# Patient Record
Sex: Male | Born: 1935 | Race: White | Hispanic: No | Marital: Married | Smoking: Former smoker
Health system: Southern US, Community
[De-identification: ages and names within clinical notes are randomized; demographics above are authoritative.]

## PROBLEM LIST (undated history)

## (undated) DIAGNOSIS — N186 End stage renal disease: Secondary | ICD-10-CM

## (undated) DIAGNOSIS — E785 Hyperlipidemia, unspecified: Secondary | ICD-10-CM

## (undated) DIAGNOSIS — IMO0001 Reserved for inherently not codable concepts without codable children: Secondary | ICD-10-CM

## (undated) DIAGNOSIS — E119 Type 2 diabetes mellitus without complications: Secondary | ICD-10-CM

## (undated) DIAGNOSIS — Z789 Other specified health status: Secondary | ICD-10-CM

## (undated) DIAGNOSIS — M109 Gout, unspecified: Secondary | ICD-10-CM

## (undated) DIAGNOSIS — I482 Chronic atrial fibrillation, unspecified: Secondary | ICD-10-CM

## (undated) DIAGNOSIS — I10 Essential (primary) hypertension: Secondary | ICD-10-CM

## (undated) HISTORY — PX: CHOLECYSTECTOMY: SHX55

---

## 1999-08-01 ENCOUNTER — Encounter: Payer: Self-pay | Admitting: Emergency Medicine

## 1999-08-02 ENCOUNTER — Inpatient Hospital Stay (HOSPITAL_COMMUNITY): Admission: EM | Admit: 1999-08-02 | Discharge: 1999-08-06 | Payer: Self-pay | Admitting: Emergency Medicine

## 1999-08-02 ENCOUNTER — Encounter: Payer: Self-pay | Admitting: Internal Medicine

## 1999-08-03 ENCOUNTER — Encounter: Payer: Self-pay | Admitting: General Surgery

## 2014-05-17 ENCOUNTER — Other Ambulatory Visit (HOSPITAL_COMMUNITY): Payer: Self-pay

## 2014-05-17 ENCOUNTER — Other Ambulatory Visit (HOSPITAL_COMMUNITY): Payer: Self-pay | Admitting: Family Medicine

## 2014-05-17 DIAGNOSIS — R011 Cardiac murmur, unspecified: Secondary | ICD-10-CM

## 2014-05-18 ENCOUNTER — Encounter (HOSPITAL_COMMUNITY): Payer: Self-pay | Admitting: Family Medicine

## 2014-05-21 ENCOUNTER — Encounter (HOSPITAL_COMMUNITY): Payer: Self-pay | Admitting: Cardiology

## 2015-12-20 ENCOUNTER — Inpatient Hospital Stay
Admission: EM | Admit: 2015-12-20 | Discharge: 2016-01-11 | DRG: 682 | Disposition: E | Payer: Medicare Other | Attending: Internal Medicine | Admitting: Internal Medicine

## 2015-12-20 ENCOUNTER — Emergency Department: Payer: Medicare Other

## 2015-12-20 DIAGNOSIS — Z531 Procedure and treatment not carried out because of patient's decision for reasons of belief and group pressure: Secondary | ICD-10-CM | POA: Diagnosis not present

## 2015-12-20 DIAGNOSIS — R571 Hypovolemic shock: Secondary | ICD-10-CM | POA: Diagnosis not present

## 2015-12-20 DIAGNOSIS — Z7982 Long term (current) use of aspirin: Secondary | ICD-10-CM | POA: Diagnosis not present

## 2015-12-20 DIAGNOSIS — Z803 Family history of malignant neoplasm of breast: Secondary | ICD-10-CM | POA: Diagnosis not present

## 2015-12-20 DIAGNOSIS — D631 Anemia in chronic kidney disease: Secondary | ICD-10-CM | POA: Diagnosis present

## 2015-12-20 DIAGNOSIS — N19 Unspecified kidney failure: Secondary | ICD-10-CM | POA: Diagnosis present

## 2015-12-20 DIAGNOSIS — M25422 Effusion, left elbow: Secondary | ICD-10-CM | POA: Diagnosis present

## 2015-12-20 DIAGNOSIS — K921 Melena: Secondary | ICD-10-CM | POA: Diagnosis not present

## 2015-12-20 DIAGNOSIS — E875 Hyperkalemia: Secondary | ICD-10-CM | POA: Diagnosis present

## 2015-12-20 DIAGNOSIS — I472 Ventricular tachycardia: Secondary | ICD-10-CM | POA: Diagnosis present

## 2015-12-20 DIAGNOSIS — D472 Monoclonal gammopathy: Secondary | ICD-10-CM | POA: Diagnosis present

## 2015-12-20 DIAGNOSIS — R0989 Other specified symptoms and signs involving the circulatory and respiratory systems: Secondary | ICD-10-CM

## 2015-12-20 DIAGNOSIS — N186 End stage renal disease: Secondary | ICD-10-CM | POA: Diagnosis present

## 2015-12-20 DIAGNOSIS — D691 Qualitative platelet defects: Secondary | ICD-10-CM | POA: Diagnosis present

## 2015-12-20 DIAGNOSIS — I120 Hypertensive chronic kidney disease with stage 5 chronic kidney disease or end stage renal disease: Principal | ICD-10-CM | POA: Diagnosis present

## 2015-12-20 DIAGNOSIS — E162 Hypoglycemia, unspecified: Secondary | ICD-10-CM | POA: Diagnosis present

## 2015-12-20 DIAGNOSIS — R778 Other specified abnormalities of plasma proteins: Secondary | ICD-10-CM | POA: Diagnosis present

## 2015-12-20 DIAGNOSIS — I129 Hypertensive chronic kidney disease with stage 1 through stage 4 chronic kidney disease, or unspecified chronic kidney disease: Secondary | ICD-10-CM | POA: Diagnosis not present

## 2015-12-20 DIAGNOSIS — Z515 Encounter for palliative care: Secondary | ICD-10-CM | POA: Diagnosis not present

## 2015-12-20 DIAGNOSIS — K920 Hematemesis: Secondary | ICD-10-CM | POA: Diagnosis present

## 2015-12-20 DIAGNOSIS — G9341 Metabolic encephalopathy: Secondary | ICD-10-CM | POA: Diagnosis not present

## 2015-12-20 DIAGNOSIS — D62 Acute posthemorrhagic anemia: Secondary | ICD-10-CM | POA: Diagnosis not present

## 2015-12-20 DIAGNOSIS — I482 Chronic atrial fibrillation, unspecified: Secondary | ICD-10-CM | POA: Diagnosis present

## 2015-12-20 DIAGNOSIS — Z66 Do not resuscitate: Secondary | ICD-10-CM | POA: Diagnosis not present

## 2015-12-20 DIAGNOSIS — I959 Hypotension, unspecified: Secondary | ICD-10-CM | POA: Diagnosis not present

## 2015-12-20 DIAGNOSIS — N184 Chronic kidney disease, stage 4 (severe): Secondary | ICD-10-CM | POA: Diagnosis not present

## 2015-12-20 DIAGNOSIS — I248 Other forms of acute ischemic heart disease: Secondary | ICD-10-CM | POA: Diagnosis present

## 2015-12-20 DIAGNOSIS — E1122 Type 2 diabetes mellitus with diabetic chronic kidney disease: Secondary | ICD-10-CM | POA: Diagnosis present

## 2015-12-20 DIAGNOSIS — R7989 Other specified abnormal findings of blood chemistry: Secondary | ICD-10-CM | POA: Diagnosis not present

## 2015-12-20 DIAGNOSIS — E119 Type 2 diabetes mellitus without complications: Secondary | ICD-10-CM

## 2015-12-20 DIAGNOSIS — H9190 Unspecified hearing loss, unspecified ear: Secondary | ICD-10-CM | POA: Diagnosis present

## 2015-12-20 DIAGNOSIS — E1165 Type 2 diabetes mellitus with hyperglycemia: Secondary | ICD-10-CM | POA: Diagnosis present

## 2015-12-20 DIAGNOSIS — E872 Acidosis: Secondary | ICD-10-CM | POA: Diagnosis present

## 2015-12-20 DIAGNOSIS — R55 Syncope and collapse: Secondary | ICD-10-CM | POA: Diagnosis not present

## 2015-12-20 DIAGNOSIS — M109 Gout, unspecified: Secondary | ICD-10-CM | POA: Diagnosis present

## 2015-12-20 DIAGNOSIS — Z79899 Other long term (current) drug therapy: Secondary | ICD-10-CM | POA: Diagnosis not present

## 2015-12-20 DIAGNOSIS — E785 Hyperlipidemia, unspecified: Secondary | ICD-10-CM | POA: Diagnosis present

## 2015-12-20 DIAGNOSIS — R627 Adult failure to thrive: Secondary | ICD-10-CM | POA: Diagnosis present

## 2015-12-20 DIAGNOSIS — Z87891 Personal history of nicotine dependence: Secondary | ICD-10-CM | POA: Diagnosis not present

## 2015-12-20 DIAGNOSIS — M25529 Pain in unspecified elbow: Secondary | ICD-10-CM

## 2015-12-20 DIAGNOSIS — E11649 Type 2 diabetes mellitus with hypoglycemia without coma: Secondary | ICD-10-CM | POA: Diagnosis present

## 2015-12-20 DIAGNOSIS — K922 Gastrointestinal hemorrhage, unspecified: Secondary | ICD-10-CM | POA: Diagnosis not present

## 2015-12-20 DIAGNOSIS — Z794 Long term (current) use of insulin: Secondary | ICD-10-CM | POA: Diagnosis not present

## 2015-12-20 DIAGNOSIS — N179 Acute kidney failure, unspecified: Secondary | ICD-10-CM | POA: Insufficient documentation

## 2015-12-20 DIAGNOSIS — R112 Nausea with vomiting, unspecified: Secondary | ICD-10-CM

## 2015-12-20 DIAGNOSIS — E871 Hypo-osmolality and hyponatremia: Secondary | ICD-10-CM | POA: Diagnosis present

## 2015-12-20 DIAGNOSIS — D696 Thrombocytopenia, unspecified: Secondary | ICD-10-CM | POA: Diagnosis present

## 2015-12-20 DIAGNOSIS — I10 Essential (primary) hypertension: Secondary | ICD-10-CM | POA: Diagnosis present

## 2015-12-20 HISTORY — DX: Essential (primary) hypertension: I10

## 2015-12-20 HISTORY — DX: Other specified health status: Z78.9

## 2015-12-20 HISTORY — DX: Reserved for inherently not codable concepts without codable children: IMO0001

## 2015-12-20 HISTORY — DX: End stage renal disease: N18.6

## 2015-12-20 HISTORY — DX: Gout, unspecified: M10.9

## 2015-12-20 HISTORY — DX: Type 2 diabetes mellitus without complications: E11.9

## 2015-12-20 HISTORY — DX: Hyperlipidemia, unspecified: E78.5

## 2015-12-20 HISTORY — DX: Chronic atrial fibrillation, unspecified: I48.20

## 2015-12-20 LAB — URINALYSIS COMPLETE WITH MICROSCOPIC (ARMC ONLY)
BILIRUBIN URINE: NEGATIVE
Glucose, UA: NEGATIVE mg/dL
Hgb urine dipstick: NEGATIVE
KETONES UR: NEGATIVE mg/dL
LEUKOCYTES UA: NEGATIVE
NITRITE: NEGATIVE
Protein, ur: NEGATIVE mg/dL
RBC / HPF: NONE SEEN RBC/hpf (ref 0–5)
SPECIFIC GRAVITY, URINE: 1.011 (ref 1.005–1.030)
pH: 5 (ref 5.0–8.0)

## 2015-12-20 LAB — BASIC METABOLIC PANEL
ANION GAP: 16 — AB (ref 5–15)
BUN: 143 mg/dL — ABNORMAL HIGH (ref 6–20)
CALCIUM: 8.9 mg/dL (ref 8.9–10.3)
CO2: 18 mmol/L — AB (ref 22–32)
Chloride: 102 mmol/L (ref 101–111)
Creatinine, Ser: 7.55 mg/dL — ABNORMAL HIGH (ref 0.61–1.24)
GFR, EST AFRICAN AMERICAN: 7 mL/min — AB (ref >60)
GFR, EST NON AFRICAN AMERICAN: 6 mL/min — AB (ref >60)
GLUCOSE: 81 mg/dL (ref 65–99)
POTASSIUM: 5 mmol/L (ref 3.5–5.1)
Sodium: 136 mmol/L (ref 135–145)

## 2015-12-20 LAB — GLUCOSE, CAPILLARY
GLUCOSE-CAPILLARY: 51 mg/dL — AB (ref 65–99)
GLUCOSE-CAPILLARY: 52 mg/dL — AB (ref 65–99)
Glucose-Capillary: 88 mg/dL (ref 65–99)
Glucose-Capillary: 101 mg/dL — ABNORMAL HIGH (ref 65–99)
Glucose-Capillary: 75 mg/dL (ref 65–99)

## 2015-12-20 LAB — CBC WITH DIFFERENTIAL/PLATELET
BASOS ABS: 0 10*3/uL (ref 0–0.1)
BASOS PCT: 0 %
EOS PCT: 1 %
Eosinophils Absolute: 0.1 10*3/uL (ref 0–0.7)
HEMATOCRIT: 37.7 % — AB (ref 40.0–52.0)
Hemoglobin: 12.6 g/dL — ABNORMAL LOW (ref 13.0–18.0)
LYMPHS ABS: 0.7 10*3/uL — AB (ref 1.0–3.6)
LYMPHS PCT: 7 %
MCH: 30.8 pg (ref 26.0–34.0)
MCHC: 33.5 g/dL (ref 32.0–36.0)
MCV: 92 fL (ref 80.0–100.0)
MONO ABS: 1 10*3/uL (ref 0.2–1.0)
MONOS PCT: 9 %
Neutro Abs: 8.5 10*3/uL — ABNORMAL HIGH (ref 1.4–6.5)
Neutrophils Relative %: 83 %
Platelets: 156 10*3/uL (ref 150–440)
RBC: 4.1 MIL/uL — AB (ref 4.40–5.90)
RDW: 15.2 % — AB (ref 11.5–14.5)
WBC: 10.3 10*3/uL (ref 3.8–10.6)

## 2015-12-20 LAB — TROPONIN I: Troponin I: 0.21 ng/mL — ABNORMAL HIGH (ref <0.031)

## 2015-12-20 MED ORDER — DEXTROSE 50 % IV SOLN
INTRAVENOUS | Status: AC
Start: 2015-12-20 — End: 2015-12-20
  Administered 2015-12-20: 22:00:00
  Filled 2015-12-20: qty 50

## 2015-12-20 MED ORDER — HEPARIN SODIUM (PORCINE) 5000 UNIT/ML IJ SOLN
5000.0000 [IU] | Freq: Three times a day (TID) | INTRAMUSCULAR | Status: DC
  Administered 2015-12-21 – 2015-12-26 (×16): 5000 [IU] via SUBCUTANEOUS
  Filled 2015-12-20 (×16): qty 1

## 2015-12-20 MED ORDER — DEXTROSE 50 % IV SOLN
25.0000 mL | Freq: Once | INTRAVENOUS | Status: DC
  Administered 2015-12-21: 25 g via INTRAVENOUS
  Filled 2015-12-20: qty 50

## 2015-12-20 MED ORDER — DIGOXIN 125 MCG PO TABS
0.1250 mg | ORAL_TABLET | Freq: Every day | ORAL | Status: DC
  Administered 2015-12-21: 0.125 mg via ORAL
  Filled 2015-12-20: qty 1

## 2015-12-20 MED ORDER — SIMVASTATIN 40 MG PO TABS
40.0000 mg | ORAL_TABLET | Freq: Every day | ORAL | Status: DC
  Administered 2015-12-21 – 2015-12-22 (×2): 40 mg via ORAL
  Filled 2015-12-20 (×2): qty 1

## 2015-12-20 MED ORDER — SODIUM CHLORIDE 0.9 % IV SOLN: INTRAVENOUS | Status: DC

## 2015-12-20 MED ORDER — DEXTROSE-NACL 5-0.9 % IV SOLN
INTRAVENOUS | Status: DC
  Administered 2015-12-20: 22:00:00 via INTRAVENOUS

## 2015-12-20 MED ORDER — ASPIRIN EC 81 MG PO TBEC
81.0000 mg | DELAYED_RELEASE_TABLET | Freq: Every day | ORAL | Status: DC
  Administered 2015-12-21 – 2015-12-25 (×5): 81 mg via ORAL
  Filled 2015-12-20 (×5): qty 1

## 2015-12-20 MED ORDER — ACETAMINOPHEN 650 MG RE SUPP
650.0000 mg | Freq: Four times a day (QID) | RECTAL | Status: DC | PRN
Start: 2015-12-20 — End: 2015-12-27

## 2015-12-20 MED ORDER — SODIUM CHLORIDE 0.9% FLUSH
3.0000 mL | Freq: Two times a day (BID) | INTRAVENOUS | Status: DC
  Administered 2015-12-20 – 2015-12-26 (×12): 3 mL via INTRAVENOUS

## 2015-12-20 MED ORDER — ONDANSETRON HCL 4 MG/2ML IJ SOLN
4.0000 mg | Freq: Four times a day (QID) | INTRAMUSCULAR | Status: DC | PRN
  Administered 2015-12-22 – 2015-12-26 (×3): 4 mg via INTRAVENOUS
  Filled 2015-12-20 (×3): qty 2

## 2015-12-20 MED ORDER — ACETAMINOPHEN 325 MG PO TABS
650.0000 mg | ORAL_TABLET | Freq: Four times a day (QID) | ORAL | Status: DC | PRN
Start: 2015-12-20 — End: 2015-12-27

## 2015-12-20 MED ORDER — ONDANSETRON HCL 4 MG PO TABS: 4.0000 mg | ORAL_TABLET | Freq: Four times a day (QID) | ORAL | Status: DC | PRN

## 2015-12-20 NOTE — ED Notes (Signed)
fsbs 75 - Dr Clearnce Hasten notified

## 2015-12-20 NOTE — ED Provider Notes (Addendum)
[facility] Emergency Department Provider Note  ____________________________________________  Time seen: Seen upon arrival to the emergency department  I have reviewed the triage vital signs and the nursing notes.   HISTORY  Chief Complaint Hypoglycemia    HPI Anker Kravec is a 82 y.o. male with a history of diabetes who is presenting today with hypoglycemia. Per the EMS crew he has been seen twice today by EMS. He was seen this morning with hypoglycemia to the 40s. He was found by his daughter to be unresponsive. By EMS she was given an amp of D50 and then he had returned to his baseline. He had no complaints after the initial D50 this morning and stated home. He did not eat all day and then was found to be unresponsive again. Given D50 en route with return to his baseline level of consciousness and also started on D10 infusion. The patient has no complaints and is not claiming any pain right now. Despite still taking his insulin and he has not eaten all day. His wife is currently in the hospital at this time. He denies chest pain, burning with urination cough or fever. When asked why he didn't need he says "I guess I wasn't hungry."   Past Medical History  Diagnosis Date  . Chronic a-fib ([place])   . Type 2 diabetes mellitus ([place])   . Gout   . HTN (hypertension)   . HLD (hyperlipidemia)   . ESRD (end stage renal disease) [facility])     Patient Active Problem List   Diagnosis Date Noted  . Chronic a-fib ([place]) 12/16/2015  . Hypoglycemia 01/08/2016  . Elevated troponin 12/28/2015  . Type 2 diabetes mellitus ([place]) 01/01/2016  . HTN (hypertension) 01/02/2016  . HLD (hyperlipidemia) 01/08/2016    Past Surgical History  Procedure Laterality Date  . Cholecystectomy      Current Outpatient Rx  Name  Route  Sig  Dispense  Refill  . allopurinol (ZYLOPRIM) 300 MG tablet   Oral   Take 300 mg by mouth daily.         Marland Kitchen aspirin EC 81 MG tablet   Oral   Take 81  mg by mouth daily.         . digoxin (LANOXIN) 0.125 MG tablet   Oral   Take 0.125 mg by mouth daily.         . furosemide (LASIX) 20 MG tablet   Oral   Take 20 mg by mouth daily.         Marland Kitchen glimepiride (AMARYL) 4 MG tablet   Oral   Take 6 mg by mouth daily with breakfast.         . insulin glargine (LANTUS) 100 UNIT/ML injection   Subcutaneous   Inject 50 Units into the skin daily.          . pioglitazone (ACTOS) 45 MG tablet   Oral   Take 45 mg by mouth daily.         . simvastatin (ZOCOR) 40 MG tablet   Oral   Take 40 mg by mouth daily.          Marland Kitchen torsemide (DEMADEX) 20 MG tablet   Oral   Take 20 mg by mouth daily.         . Vitamin D, Ergocalciferol, (DRISDOL) 50000 units CAPS capsule   Oral   Take 50,000 Units by mouth every 7 (seven) days. Pt takes on Monday.  Allergies Review of patient's allergies indicates no known allergies.  Family History  Problem Relation Age of Onset  . Breast cancer Mother     Social History Social History  Substance Use Topics  . Smoking status: Never Smoker   . Smokeless tobacco: Not on file  . Alcohol Use: No    Review of Systems Constitutional: No fever/chills Eyes: No visual changes. ENT: No sore throat. Cardiovascular: Denies chest pain. Respiratory: Denies shortness of breath. Gastrointestinal: No abdominal pain.  No nausea, no vomiting.  No diarrhea.  No constipation. Genitourinary: Negative for dysuria. Musculoskeletal: Negative for back pain. Skin: Negative for rash. Neurological: Negative for headaches, focal weakness or numbness.  10-point ROS otherwise negative.  ____________________________________________   PHYSICAL EXAM:  VITAL SIGNS: ED Triage Vitals  Enc Vitals Group     BP 12/19/2015 1841 136/60 mmHg     Pulse Rate 01/08/2016 1841 92     Resp --      Temp 01/07/2016 1841 98.3 F (36.8 C)     Temp Source 12/18/2015 1841 Oral     SpO2 12/23/2015 1841 99 %     Weight  12/22/2015 1841 240 lb (108.863 kg)     Height 01/05/2016 1841 6\' 1"  (1.854 m)     Head Cir --      Peak Flow --      Pain Score --      Pain Loc --      Pain Edu? --      Excl. in [place]? --     Constitutional: Alert and oriented. Well appearing and in no acute distress. Eyes: Conjunctivae are normal. PERRL. EOMI. Head: Atraumatic. Nose: No congestion/rhinnorhea. Mouth/Throat: Mucous membranes are moist.  Oropharynx non-erythematous. Neck: No stridor.   Cardiovascular: Normal rate, regular rhythm. Grossly normal heart sounds.  Good peripheral circulation. Respiratory: Normal respiratory effort.  No retractions. Lungs CTAB. Gastrointestinal: Soft and nontender. No distention. No abdominal bruits. No CVA tenderness. Musculoskeletal: No lower extremity tenderness.  Bilateral and moderate lower extremity edema which the patient says is chronic. No joint effusions. Neurologic:  Normal speech and language. No gross focal neurologic deficits are appreciated. No gait instability. Skin:  Skin is warm, dry and intact. No rash noted. Psychiatric: Mood and affect are normal. Speech and behavior are normal.  ____________________________________________   LABS (all labs ordered are listed, but only abnormal results are displayed)  Labs Reviewed  CBC WITH DIFFERENTIAL/PLATELET - Abnormal; Notable for the following:    RBC 4.10 (*)    Hemoglobin 12.6 (*)    HCT 37.7 (*)    RDW 15.2 (*)    Neutro Abs 8.5 (*)    Lymphs Abs 0.7 (*)    All other components within normal limits  BASIC METABOLIC PANEL - Abnormal; Notable for the following:    CO2 18 (*)    BUN 143 (*)    Creatinine, Ser 7.55 (*)    GFR calc non Af Amer 6 (*)    GFR calc Af Amer 7 (*)    Anion gap 16 (*)    All other components within normal limits  TROPONIN I - Abnormal; Notable for the following:    Troponin I 0.21 (*)    All other components within normal limits  URINALYSIS COMPLETEWITH MICROSCOPIC ([facility] ONLY) - Abnormal;  Notable for the following:    Color, Urine STRAW (*)    APPearance CLEAR (*)    Bacteria, UA RARE (*)    Squamous Epithelial / LPF 0-5 (*)  All other components within normal limits  GLUCOSE, CAPILLARY - Abnormal; Notable for the following:    Glucose-Capillary 101 (*)    All other components within normal limits  GLUCOSE, CAPILLARY  CBG MONITORING, ED  CBG MONITORING, ED  CBG MONITORING, ED  CBG MONITORING, ED   ____________________________________________  EKG  ED ECG REPORT I, [place],  Youlanda Roys, the attending physician, personally viewed and interpreted this ECG.   Date: 01/09/2016  EKG Time: 1920  Rate: 96  Rhythm: atrial fibrillation, rate 96  Axis: Normal  Intervals:none  ST&T Change: No ST segment elevation or depression. No abnormal T-wave inversion.  ____________________________________________  RADIOLOGY  Cardiomegaly with mild vascular congestion. Right pleural effusion. No overt failure or infiltrates. ____________________________________________   PROCEDURES   ____________________________________________   INITIAL IMPRESSION / ASSESSMENT AND PLAN / ED COURSE  Pertinent labs & imaging results that were available during my care of the patient were reviewed by me and considered in my medical decision making (see chart for details).  ----------------------------------------- 9:10 PM on 01/04/2016 -----------------------------------------  Patient able to tolerate by mouth and maintain normal glucose levels. Discussed with the patient his severely impaired kidney function and need for [facility]. This is likely the reason for his repeat episodes of hypoglycemia because he is not clearing his diabetes medications. It is also likely that he is becoming hypoglycemic because he has decreased appetite and is not taking any food by mouth. He will be admitted to the hospital. He was signed out to Dr. Anselm Jungling. The patient as well as his son understand  this plan and are willing to comply per the patient continues without any complaints at this time. ____________________________________________   FINAL CLINICAL IMPRESSION(S) / ED DIAGNOSES  Hypoglycemia, repeat episodes. Acute renal failure. Uremia.    Orbie Pyo, MD 01/02/2016 2111  Patient with intact mentation but with repeat glucose at 51. We'll give one half ampule of D50 and then start a D5 infusion. I alerted the inpatient team, Dr. Jannifer Franklin who agrees with this plan.  Orbie Pyo, MD 01/09/2016 2155

## 2015-12-20 NOTE — H&P (Addendum)
[place] [facility] [place] NAME: Miguel Guerrero    MR#:  [id number]  DATE OF BIRTH:  1933/08/15  DATE OF ADMISSION:  12/26/2015  PRIMARY CARE PHYSICIAN: No primary care provider on file.   REQUESTING/REFERRING PHYSICIAN: Clearnce Hasten, MD  CHIEF COMPLAINT:   Chief Complaint  Patient presents with  . Hypoglycemia    HISTORY OF PRESENT ILLNESS:  Miguel Guerrero  is a 82 y.o. male who presents with 2 episodes of hypoglycemia today, with confusion and decreased responsiveness. Patient is hard of hearing, and also does not remember everything about the events today. His sons are present with him in the ED and provide the history. They state that he was found this morning by one son, and that he was on the floor beside his bed and had soiled himself. He was acting confused, so his son called EMS and they found his blood sugar to be in the 40s. They gave him hypoglycemic treatment as blood sugar improved, and his symptoms resolved somewhat. Later that afternoon he was found by his other son and his daughter to be unresponsive. EMS was called again his blood sugar was again found to be extremely low. He was given hypoglycemic treatment and brought to the ED for evaluation. His son states that recently he has had very poor by mouth intake. Despite this a couple weeks of his blood sugars very elevated and his anti-glycemic medications were increased. He is also been complaining of being very cold all the time recently, and of not having any energy and sleeping most of the time. He denies any infectious symptoms. He denies any chest pain. In the ED was found to have a mildly elevated troponin, a very high creatinine (this is in the setting of known severe stage IV chronic kidney disease). Hospitalists were called for further evaluation and admission.  PAST MEDICAL HISTORY:   Past Medical History  Diagnosis Date  . Chronic a-fib ([place])   . Type 2 diabetes mellitus ([place])    . Gout   . HTN (hypertension)   . HLD (hyperlipidemia)   . ESRD (end stage renal disease) ([place])     PAST SURGICAL HISTORY:   Past Surgical History  Procedure Laterality Date  . Cholecystectomy      SOCIAL HISTORY:   Social History  Substance Use Topics  . Smoking status: Never Smoker   . Smokeless tobacco: Not on file  . Alcohol Use: No    FAMILY HISTORY:   Family History  Problem Relation Age of Onset  . Breast cancer Mother     DRUG ALLERGIES:  No Known Allergies  MEDICATIONS AT HOME:   Prior to Admission medications   Medication Sig Start Date End Date Taking? Authorizing Provider  allopurinol (ZYLOPRIM) 300 MG tablet Take 300 mg by mouth daily.   Yes Historical Provider, MD  aspirin EC 81 MG tablet Take 81 mg by mouth daily.   Yes Historical Provider, MD  digoxin (LANOXIN) 0.125 MG tablet Take 0.125 mg by mouth daily.   Yes Historical Provider, MD  furosemide (LASIX) 20 MG tablet Take 20 mg by mouth daily.   Yes Historical Provider, MD  glimepiride (AMARYL) 4 MG tablet Take 6 mg by mouth daily with breakfast.   Yes Historical Provider, MD  insulin glargine (LANTUS) 100 UNIT/ML injection Inject 50 Units into the skin daily.    Yes Historical Provider, MD  pioglitazone (ACTOS) 45 MG tablet Take 45 mg by mouth daily.  Yes Historical Provider, MD  simvastatin (ZOCOR) 40 MG tablet Take 40 mg by mouth daily.    Yes Historical Provider, MD  torsemide (DEMADEX) 20 MG tablet Take 20 mg by mouth daily.   Yes Historical Provider, MD  Vitamin D, Ergocalciferol, (DRISDOL) 50000 units CAPS capsule Take 50,000 Units by mouth every 7 (seven) days. Pt takes on Monday.   Yes Historical Provider, MD    REVIEW OF SYSTEMS:  Review of Systems  Constitutional: Negative for fever, chills, weight loss and malaise/fatigue.  HENT: Negative for ear pain, hearing loss and tinnitus.   Eyes: Negative for blurred vision, double vision, pain and redness.  Respiratory: Negative for cough,  hemoptysis and shortness of breath.   Cardiovascular: Negative for chest pain, palpitations, orthopnea and leg swelling.  Gastrointestinal: Negative for nausea, vomiting, abdominal pain, diarrhea and constipation.  Genitourinary: Negative for dysuria, frequency and hematuria.  Musculoskeletal: Negative for back pain, joint pain and neck pain.  Skin:       No acne, rash, or lesions  Neurological: Negative for dizziness, tremors, focal weakness and weakness.       Intermittent confusion  Endo/Heme/Allergies: Negative for polydipsia. Does not bruise/bleed easily.       Hypoglycemia  Psychiatric/Behavioral: Negative for depression. The patient is not nervous/anxious and does not have insomnia.      VITAL SIGNS:   Filed Vitals:   12/21/2015 1841 12/21/2015 1845 01/09/2016 1915 01/01/2016 1941  BP: 136/60   142/52  Pulse: 92  106 90  Temp: 98.3 F (36.8 C)     TempSrc: Oral     Resp:  24 17 16   Height: 6\' 1"  (1.854 m)     Weight: 108.863 kg (240 lb)     SpO2: 99%  100% 98%   Wt Readings from Last 3 Encounters:  01/07/2016 108.863 kg (240 lb)    PHYSICAL EXAMINATION:  Physical Exam  Vitals reviewed. Constitutional: He is oriented to person, place, and time. He appears well-developed and well-nourished. No distress.  HENT:  Head: Normocephalic and atraumatic.  Mouth/Throat: Oropharynx is clear and moist.  Hard of hearing  Eyes: Conjunctivae and EOM are normal. Pupils are equal, round, and reactive to light. No scleral icterus.  Neck: Normal range of motion. Neck supple. No JVD present. No thyromegaly present.  Cardiovascular: Normal rate and intact distal pulses.  Exam reveals no gallop and no friction rub.   No murmur heard. irregular  Respiratory: Effort normal and breath sounds normal. No respiratory distress. He has no wheezes. He has no rales.  GI: Soft. Bowel sounds are normal. He exhibits no distension. There is no tenderness.  Musculoskeletal: Normal range of motion. He exhibits  no edema.  No arthritis, no gout  Lymphadenopathy:    He has no cervical adenopathy.  Neurological: He is alert and oriented to person, place, and time. No cranial nerve deficit.  No dysarthria, no aphasia  Skin: Skin is warm and dry. No rash noted. No erythema.  Psychiatric: He has a normal mood and affect. His behavior is normal. Judgment and thought content normal.    LABORATORY PANEL:   CBC  Recent Labs Lab 01/06/2016 1847  WBC 10.3  HGB 12.6*  HCT 37.7*  PLT 156   ------------------------------------------------------------------------------------------------------------------  Chemistries   Recent Labs Lab 12/31/2015 1847  NA 136  K 5.0  CL 102  CO2 18*  GLUCOSE 81  BUN 143*  CREATININE 7.55*  CALCIUM 8.9   ------------------------------------------------------------------------------------------------------------------  Cardiac Enzymes  Recent Labs  Lab 12/14/2015 1847  TROPONINI 0.21*   ------------------------------------------------------------------------------------------------------------------  RADIOLOGY:  Dg Chest 2 View  01/06/2016  CLINICAL DATA:  Repeated episodes of hypoglycemia. EXAM: CHEST  2 VIEW COMPARISON:  None. FINDINGS: Cardiomegaly. Mild vascular congestion. RIGHT pleural effusion. No overt failure or focal infiltrates. No areas of significant atelectasis. Indeterminate age lower thoracic compression deformity. Osteopenia. IMPRESSION: Cardiomegaly with mild vascular congestion. RIGHT pleural effusion. No overt failure or infiltrates. Electronically Signed   By: Staci Righter M.D.   On: 12/15/2015 19:31    EKG:   Orders placed or performed during the hospital encounter of 01/01/2016  . ED EKG  . ED EKG  . EKG 12-Lead  . EKG 12-Lead  . EKG 12-Lead  . EKG 12-Lead    IMPRESSION AND PLAN:  Principal Problem:   Hypoglycemia - responds well to hypoglycemic protocol. We will admit him, hold all anti-glycemic medications, check his  fingerstick every 2 hours. Encourage by mouth intake Active Problems:   ESRD - this is a decrease in function from his prior reported GFR of around 19.  Will have nephrology see him and make recommendations   Elevated troponin - likely related to his chronic kidney disease and some part, also potentially related to demand from his A. fib with question of some possible underlying heart failure. However, it is impossible this time to completely rule out a primary ACS event. We will trend his troponins tonight, check an echocardiogram, get a cardiology consult.   Type 2 diabetes mellitus (HCC) - every 2 hour glucose checks as above, hypoglycemic protocol, carb modified diet, no anti-glycemic medications for now, can add these as needed when his blood sugar rises.   Chronic a-fib (HCC) - continue rate controlling medications. Patient was on anticoagulation years ago, it is unclear why he was taken off. Cardiology consult as above.   HTN (hypertension) - stable, continue home meds   HLD (hyperlipidemia) - continue home meds  All the records are reviewed and case discussed with ED provider. Management plans discussed with the patient and/or family.  DVT PROPHYLAXIS: SubQ heparin  GI PROPHYLAXIS: None  ADMISSION STATUS: Inpatient  CODE STATUS: Full Code Status History    This patient does not have a recorded code status. Please follow your organizational policy for patients in this situation.      TOTAL TIME TAKING CARE OF THIS PATIENT: 50 minutes.    Naleigha Raimondi [place] 12/19/2015, 9:10 PM  [facility]  Office  [phone]  CC: Primary care physician; No primary care provider on file.

## 2015-12-20 NOTE — ED Notes (Signed)
Alert and oriented X4 - Ate half a ham sandwich and drank half can of ginger ale

## 2015-12-20 NOTE — ED Notes (Signed)
Troponin of 0.21 reported to Dr Clearnce Hasten

## 2015-12-20 NOTE — ED Notes (Signed)
fsbs 52 with repeat to verify reading fsbs 12 - dr Clearnce Hasten  Notified and verbal orders received to give 12.5 gm of 50% dextrose now

## 2015-12-20 NOTE — ED Notes (Signed)
Pt arrived by EMS - EMS had arrived at the pt house this am and found unresponsive with a fsbs of 40 - they gave amp of D50 and cleared the scene once fsbs stable - EMS called a second time to the house this afternoon - pt found unresponsive and sweaty with a fsbs 56 - amp of D50 given bringing fsbs 125 - D10 IV at this time - Pt has 18 gauge in rt AC - last fsbs 118 and trending down - no other complaints - Pt is incontinent at this time - Hx of afib - Dtr states he has not eaten well in a few days

## 2015-12-21 ENCOUNTER — Inpatient Hospital Stay (HOSPITAL_COMMUNITY)
Admit: 2015-12-21 | Discharge: 2015-12-21 | Disposition: A | Discharge status: Home / Self Care | Payer: Medicare Other | Attending: Internal Medicine | Admitting: Internal Medicine

## 2015-12-21 ENCOUNTER — Encounter: Payer: Self-pay | Admitting: Student

## 2015-12-21 DIAGNOSIS — R7989 Other specified abnormal findings of blood chemistry: Secondary | ICD-10-CM

## 2015-12-21 DIAGNOSIS — I482 Chronic atrial fibrillation: Secondary | ICD-10-CM

## 2015-12-21 DIAGNOSIS — E162 Hypoglycemia, unspecified: Secondary | ICD-10-CM

## 2015-12-21 DIAGNOSIS — N179 Acute kidney failure, unspecified: Secondary | ICD-10-CM

## 2015-12-21 DIAGNOSIS — N186 End stage renal disease: Secondary | ICD-10-CM

## 2015-12-21 DIAGNOSIS — R55 Syncope and collapse: Secondary | ICD-10-CM

## 2015-12-21 LAB — GLUCOSE, CAPILLARY
GLUCOSE-CAPILLARY: 101 mg/dL — AB (ref 65–99)
GLUCOSE-CAPILLARY: 102 mg/dL — AB (ref 65–99)
GLUCOSE-CAPILLARY: 176 mg/dL — AB (ref 65–99)
GLUCOSE-CAPILLARY: 59 mg/dL — AB (ref 65–99)
GLUCOSE-CAPILLARY: 60 mg/dL — AB (ref 65–99)
GLUCOSE-CAPILLARY: 67 mg/dL (ref 65–99)
GLUCOSE-CAPILLARY: 71 mg/dL (ref 65–99)
GLUCOSE-CAPILLARY: 72 mg/dL (ref 65–99)
GLUCOSE-CAPILLARY: 79 mg/dL (ref 65–99)
GLUCOSE-CAPILLARY: 86 mg/dL (ref 65–99)
Glucose-Capillary: 37 mg/dL — CL (ref 65–99)
Glucose-Capillary: 58 mg/dL — ABNORMAL LOW (ref 65–99)
Glucose-Capillary: 69 mg/dL (ref 65–99)
Glucose-Capillary: 79 mg/dL (ref 65–99)
Glucose-Capillary: 82 mg/dL (ref 65–99)
Glucose-Capillary: 83 mg/dL (ref 65–99)
Glucose-Capillary: 87 mg/dL (ref 65–99)
Glucose-Capillary: 87 mg/dL (ref 65–99)
Glucose-Capillary: 95 mg/dL (ref 65–99)
Glucose-Capillary: 95 mg/dL (ref 65–99)

## 2015-12-21 LAB — TROPONIN I
TROPONIN I: 0.25 ng/mL — AB (ref <0.031)
TROPONIN I: 0.23 ng/mL — AB (ref <0.031)
Troponin I: 0.16 ng/mL — ABNORMAL HIGH (ref <0.031)

## 2015-12-21 LAB — CBC
HCT: 34.7 % — ABNORMAL LOW (ref 40.0–52.0)
Hemoglobin: 11.5 g/dL — ABNORMAL LOW (ref 13.0–18.0)
MCH: 30.5 pg (ref 26.0–34.0)
MCHC: 33.1 g/dL (ref 32.0–36.0)
MCV: 92.1 fL (ref 80.0–100.0)
PLATELETS: 146 10*3/uL — AB (ref 150–440)
RBC: 3.77 MIL/uL — AB (ref 4.40–5.90)
RDW: 15.5 % — AB (ref 11.5–14.5)
WBC: 9.4 10*3/uL (ref 3.8–10.6)

## 2015-12-21 LAB — CORTISOL: CORTISOL PLASMA: 22.2 ug/dL

## 2015-12-21 LAB — BASIC METABOLIC PANEL
Anion gap: 12 (ref 5–15)
BUN: 132 mg/dL — AB (ref 6–20)
CO2: 18 mmol/L — ABNORMAL LOW (ref 22–32)
CREATININE: 7.2 mg/dL — AB (ref 0.61–1.24)
Calcium: 8.3 mg/dL — ABNORMAL LOW (ref 8.9–10.3)
Chloride: 106 mmol/L (ref 101–111)
GFR, EST AFRICAN AMERICAN: 7 mL/min — AB (ref >60)
GFR, EST NON AFRICAN AMERICAN: 6 mL/min — AB (ref >60)
Glucose, Bld: 69 mg/dL (ref 65–99)
Potassium: 5.3 mmol/L — ABNORMAL HIGH (ref 3.5–5.1)
SODIUM: 136 mmol/L (ref 135–145)

## 2015-12-21 LAB — IRON AND TIBC
Iron: 54 ug/dL (ref 45–182)
Saturation Ratios: 23 % (ref 17.9–39.5)
TIBC: 236 ug/dL — AB (ref 250–450)
UIBC: 182 ug/dL

## 2015-12-21 LAB — MAGNESIUM: MAGNESIUM: 2 mg/dL (ref 1.7–2.4)

## 2015-12-21 LAB — TSH: TSH: 5.529 u[IU]/mL — ABNORMAL HIGH (ref 0.350–4.500)

## 2015-12-21 LAB — POTASSIUM
POTASSIUM: 5.5 mmol/L — AB (ref 3.5–5.1)
Potassium: 5.6 mmol/L — ABNORMAL HIGH (ref 3.5–5.1)

## 2015-12-21 LAB — DIGOXIN LEVEL: DIGOXIN LVL: 0.8 ng/mL (ref 0.8–2.0)

## 2015-12-21 LAB — FERRITIN: FERRITIN: 617 ng/mL — AB (ref 24–336)

## 2015-12-21 LAB — MRSA PCR SCREENING: MRSA by PCR: NEGATIVE

## 2015-12-21 LAB — HEMOGLOBIN A1C: HEMOGLOBIN A1C: 9.4 % — AB (ref 4.0–6.0)

## 2015-12-21 MED ORDER — DILTIAZEM HCL 25 MG/5ML IV SOLN
10.0000 mg | Freq: Once | INTRAVENOUS | Status: AC
  Administered 2015-12-21: 10 mg via INTRAVENOUS
  Filled 2015-12-21: qty 5

## 2015-12-21 MED ORDER — DEXTROSE 50 % IV SOLN
1.0000 | INTRAVENOUS | Status: DC | PRN
  Administered 2015-12-21 (×2): 25 mL via INTRAVENOUS
  Filled 2015-12-21: qty 50

## 2015-12-21 MED ORDER — DILTIAZEM HCL 60 MG PO TABS
60.0000 mg | ORAL_TABLET | Freq: Four times a day (QID) | ORAL | Status: DC
  Administered 2015-12-21 – 2015-12-22 (×3): 60 mg via ORAL
  Filled 2015-12-21 (×3): qty 1

## 2015-12-21 MED ORDER — SODIUM POLYSTYRENE SULFONATE 15 GM/60ML PO SUSP
30.0000 g | ORAL | Status: AC
  Administered 2015-12-21: 30 g via ORAL
  Filled 2015-12-21: qty 120

## 2015-12-21 MED ORDER — DEXTROSE 50 % IV SOLN
INTRAVENOUS | Status: AC
  Administered 2015-12-21: 25 g via INTRAVENOUS
  Filled 2015-12-21: qty 50

## 2015-12-21 MED ORDER — DEXTROSE 10 % IV SOLN
INTRAVENOUS | Status: DC
  Administered 2015-12-21 – 2015-12-22 (×2): via INTRAVENOUS

## 2015-12-21 MED ORDER — CARVEDILOL 3.125 MG PO TABS: 3.1250 mg | ORAL_TABLET | Freq: Two times a day (BID) | ORAL | Status: DC

## 2015-12-21 MED ORDER — SODIUM BICARBONATE 8.4 % IV SOLN
INTRAVENOUS | Status: DC
  Administered 2015-12-21 – 2015-12-24 (×3): via INTRAVENOUS
  Filled 2015-12-21 (×7): qty 150

## 2015-12-21 MED ORDER — DILTIAZEM HCL 25 MG/5ML IV SOLN
10.0000 mg | Freq: Once | INTRAVENOUS | Status: AC
  Administered 2015-12-21: 10 mg via INTRAVENOUS

## 2015-12-21 NOTE — Progress Notes (Signed)
Pt transfer to CCU 20 Report called Christina RN.

## 2015-12-21 NOTE — Progress Notes (Signed)
CBG for 2200 was 68.  Patient alert and oriented, sitting in bed, no complaints.  Given regular soda to drink, recheck CBG is 86.  Will continue to monitor.

## 2015-12-21 NOTE — Consult Note (Signed)
Cardiology Consult    Patient ID: Miguel Guerrero MRN: [id number], DOB/AGE: [birth year]   Admit date: 12/19/2015 Date of Consult: 12/21/2015  Primary Physician: No primary care provider on file. Reason for Consult: Elevated Troponin Primary Cardiologist: New to [place] Requesting Provider: Dr. Jannifer Franklin   History of Present Illness    Miguel Guerrero is a 82 y.o. male with past medical history of chronic atrial fibrillation, Type 2 DM, HTN, HLD, and ESRD (not on dialysis) who presented to [facility] on 01/01/2016 for repeat hypoglycemic episodes associated with decreased responsiveness.   On 01/08/2016, the patient was found by his son on the floor beside his bed. He was noted to be confused and had an episode of bowel incontinence. EMS was called and his blood sugar was 40 at the time of their arrival. D50 was administered and they departed once he was stable. That afternoon, he has decreased responsiveness again and EMS was called and brought him to the ED for evaluation.  At the time of admission, he was started on the hypoglycemia protocol. Throughout the day, he has refused to eat or drink anything and his glucose has been in the 60's. He was initially on D5 infusions but these have been increased to D10 and he was transferred to the ICU for closer monitoring. He is also being given D50 ampules as needed.   The patient has known Stage 4 CKD and his creatinine at the time of admission was 7.55 with a GFR of 6. WBC at 10.3. Hgb 12.6. Platelets 156. K+ elevated to 5.0 on admission, at 5.6 today. Dig level was checked and 0.8. Cyclic troponin values have been obtained and are 0.21, 0.25, 0.23, and 0.16. His EKG shows atrial fibrillation with HR 96, and nonspecific ST changes in the anterior leads.  In talking with the patient, one of his son's, and his son-in-law at the bedside they do not believe he is currently followed by a Cardiologist. The patient is able to provide history but seems confused with  some questions. He reports having atrial fibrillation for "many years". He was on Coumadin in the past but this was discontinued for an unknown reason. He denies any history of GI bleeds or frequent falls. The patient's son reports he is very independent in his care, living with his wife and taking care of their home.  The patient denies any history of CAD. Denies any recent chest pain, radiating pain, palpitations, orthopnea, PND, or dyspnea on exertion. Notes a decreased appetite over the past month but does not believe he was lost a significant amount of weight. Does report having lower extremity edema and is on a "fluid pill" for this. His son provides a list of his medications with both Torsemide 20mg  daily and Lasix 20mg  daily being on the list. He says the Lasix was recently started by his PCP to take on a PRN basis for increased lower extremity edema. He also points out the cardiac medications the patient is on which include Digoxin 0.125mg  daily, Cardizem CD 240mg  daily, and Zocor 40mg  daily in addition to the diuretics mentioned above.   Past Medical History   Past Medical History  Diagnosis Date  . Chronic a-fib ([place])   . Type 2 diabetes mellitus ([place])   . Gout   . HTN (hypertension)   . HLD (hyperlipidemia)   . ESRD (end stage renal disease) [facility])     Past Surgical History  Procedure Laterality Date  . Cholecystectomy  Allergies  No Known Allergies  Inpatient Medications    . aspirin EC  81 mg Oral Daily  . carvedilol  3.125 mg Oral BID WC  . heparin  5,000 Units Subcutaneous 3 times per day  . simvastatin  40 mg Oral Daily  . sodium chloride flush  3 mL Intravenous Q12H    Family History    Family History  Problem Relation Age of Onset  . Breast cancer Mother     Social History    Social History   Social History  . Marital Status: Married    Spouse Name: N/A  . Number of Children: N/A  . Years of Education: N/A   Occupational History  . Not on  file.   Social History Main Topics  . Smoking status: Former Smoker    Quit date: 11/12/1968  . Smokeless tobacco: Never Used  . Alcohol Use: No  . Drug Use: No  . Sexual Activity: Not Currently   Other Topics Concern  . Not on file   Social History Narrative  . No narrative on file     Review of Systems    General:  No chills, fever, night sweats or weight changes. Positive for decreased appetite. Cardiovascular:  No chest pain, dyspnea on exertion,  orthopnea, palpitations, paroxysmal nocturnal dyspnea. Positive for edema. Dermatological: No rash, lesions/masses Respiratory: No cough, dyspnea Urologic: No hematuria, dysuria Abdominal:   No nausea, vomiting, diarrhea, bright red blood per rectum, melena, or hematemesis Neurologic:  No visual changes. Positive for weakness and changes in mental status. All other systems reviewed and are otherwise negative except as noted above.  Physical Exam    Blood pressure 148/64, pulse 100, temperature 98 F (36.7 C), temperature source Oral, resp. rate 17, height 6\' 1"  (1.854 m), weight 237 lb 1.6 oz (107.548 kg), SpO2 95 %.  General: Pleasant, Caucasian male appearing in NAD. Psych: Normal affect. Neuro: Alert and oriented X 3. Moves all extremities spontaneously. HEENT: Normal  Neck: Supple without bruits or JVD. Lungs:  Resp regular and unlabored, CTA without wheezing or rales. Heart: Irregularly irregular, no s3, s4, or murmurs. Abdomen: Soft, non-tender, non-distended, BS + x 4.  Extremities: No clubbing or cyanosis. Trace lower extremity edema. DP/PT/Radials 2+ and equal bilaterally.  Labs    Troponin (Point of Care Test) No results for input(s): TROPIPOC in the last 72 hours.  Recent Labs  01/06/2016 1847 12/21/15 0228 12/21/15 0835  TROPONINI 0.21* 0.25* 0.23*   Lab Results  Component Value Date   WBC 9.4 12/21/2015   HGB 11.5* 12/21/2015   HCT 34.7* 12/21/2015   MCV 92.1 12/21/2015   PLT 146* 12/21/2015      Recent Labs Lab 12/21/15 0228 12/21/15 1016  NA 136  --   K 5.3* 5.6*  CL 106  --   CO2 18*  --   BUN 132*  --   CREATININE 7.20*  --   CALCIUM 8.3*  --   GLUCOSE 69  --     Radiology Studies    Dg Chest 2 View: 12/29/2015  CLINICAL DATA:  Repeated episodes of hypoglycemia. EXAM: CHEST  2 VIEW COMPARISON:  None. FINDINGS: Cardiomegaly. Mild vascular congestion. RIGHT pleural effusion. No overt failure or focal infiltrates. No areas of significant atelectasis. Indeterminate age lower thoracic compression deformity. Osteopenia. IMPRESSION: Cardiomegaly with mild vascular congestion. RIGHT pleural effusion. No overt failure or infiltrates. Electronically Signed   By: Staci Righter M.D.   On: 01/02/2016 19:31  EKG & Cardiac Imaging    EKG: Atrial fibrillation, HR 96, nonspecific ST changes in the anterior leads.  Echocardiogram: 12/21/2015 Study Conclusions  - Left ventricle: The cavity size was normal. There was mild concentric hypertrophy. Systolic function was vigorous. The estimated ejection fraction was in the range of 65% to 70%. Wall motion was normal; there were no regional wall motion abnormalities. - Aortic valve: moderately calcified leaflets. Transvalvular velocity was increased. There was very mild stenosis. Peak velocity (S): 234 cm/s. Peak gradient (S): 22 mm Hg. - Left atrium: The atrium was mildly dilated. - Right ventricle: Systolic function was normal. - Pulmonary arteries: Systolic pressure was mild to moderately elevated. PA peak pressure: 49 mm Hg (S).  Impressions: - Rhythm suggestive of atrial fibrillation.  Assessment & Plan     1. Elevated Troponin  - Cyclic troponin values have been 0.21, 0.25, 0.23, and 0.16. - EKG shows atrial fibrillation with a HR 96 and nonspecific ST changes in the anterior leads. - the patient denies any history of CAD. Does not report any anginal symptoms.  - Echo shows EF of 65-70% with no wall motion  abnormalities. Mild to moderate pulmonary HTN was noted. - likely to be due to demand ischemia in the setting of his ESRD, atrial fibrillation, and continued hypoglycemia.  2. Chronic Atrial Fibrillation - patient reports having this for "many years" and it is now managed by his PCP. - This patients CHA2DS2-VASc Score and unadjusted Ischemic Stroke Rate (% per year) is equal to 4.8 % stroke rate/year from a score of 4 (HTN, DM, Age (2)). He was on Coumadin in the past but this was discontinued for an unknown reason. Prior to his recent hypoglycemic episode, he denies any history of frequent falls. The family is not aware of a history of GI bleeding. He would not be a good candidate for NOAC due to his ESRD, even with potential decreased dosage of Eliquis. Could consider restarting Coumadin following improvement in his illness or this could be addressed by his PCP as an outpatient since he might have more knowledge about the patient's medical history and why this was discontinued.  - was on Digoxin 0.125mg  daily and Cardizem CD 240mg  daily for rate control prior to admission. With his ESRD, Digoxin has been discontinued. His Cardizem was not listed on the Home Medications yet his son provided a picture of the medication saying he takes it daily at home. We will restart his Cardizem at 60mg  Q6H to monitor his BP response before restarting Cardizem CD.   3. ESRD - reports a history of CKD. Unfortunately no previous lab results are available for review. - Creatinine elevated to 7.55 on admission. - Nephrology following.  4. Hypoglycemia - per admitting team  5. Hyperkalemia - potassium 5.0 on admission, elevated to 5.6 on 12/21/2015. - started on Bicarb drip and given Kayexalate earlier today   Signed, Erma Heritage, PA-C 12/21/2015, 2:27 PM Pager: [phone]

## 2015-12-21 NOTE — Progress Notes (Signed)
*  PRELIMINARY RESULTS* Echocardiogram 2D Echocardiogram has been performed.  Miguel Guerrero 12/21/2015, 1:49 PM

## 2015-12-21 NOTE — Care Management (Signed)
Patient with esrd transferred to icu stepdown  from 2A due to need for hypoglycemia for closer monitoring. Patient is not currently receiving any outpatient dialysis but has been discussing with with [facility].  Nephrology is following

## 2015-12-21 NOTE — Progress Notes (Signed)
Blood sugar 65 patient encouraged to drink 4 oz of orange juice. Patient refusing to eat or drink states that he does not feel hungry. Blood sugar rechecked and found to be 60 asymptomatic. 12.5 mg of dextrose given, blood sugar was rechecked and found to be 101 post administration of dextrose. Patient encouraged to continue to drink orange juice. Dr. Ether Griffins notified of the above occurrence. Will continue to monitor patient.

## 2015-12-21 NOTE — Progress Notes (Signed)
[place] [facility] [place] NAME: Miguel Guerrero    MR#:  [id number]  DATE OF BIRTH:  09/28/1933  SUBJECTIVE:  CHIEF COMPLAINT:   Chief Complaint  Patient presents with  . Hypoglycemia   Patient is 82 year old Caucasian male with past medical history significant for history of chronic atrial fibrillation, diabetes mellitus type 2, gout, hypertension, hyperlipidemia, CK D stage IV  who presents to the hospital with hypoglycemia episodes, confusion, weakness, feeling cold. On arrival to emergency room, he was noted to be tachypneic as well as tachycardic, however, not hypotensive. His labs revealed elevated troponin to 0.25, hyperkalemia, anemia and thrombocytopenia. Patient was admitted to the hospital for further evaluation. He was initiated on D5 10 water solution and required at least 4 Amps of D50 injection to maintain his blood glucose levels. Patient is confused and not able to provide review of systems. I met with patient's son-in-law and discussed his case for approximately 15 minutes.  Patient admits of poor appetite, just not wanting to eat. Denies nausea, vomiting, diarrhea or abdominal pain Review of Systems  Unable to perform ROS: medical condition    VITAL SIGNS: Blood pressure 129/54, pulse 97, temperature 98 F (36.7 C), temperature source Oral, resp. rate 22, height _0  (1.854 m), weight 107.548 kg (237 lb 1.6 oz), SpO2 97 %.  PHYSICAL EXAMINATION:   GENERAL:  82 y.o.-year-old patient lying in the bed with no acute distress.  EYES: Pupils equal, round, reactive to light and accommodation. No scleral icterus. Extraocular muscles intact.  HEENT: Head atraumatic, normocephalic. Oropharynx and nasopharynx clear.  NECK:  Supple, no jugular venous distention. No thyroid enlargement, no tenderness.  LUNGS: Normal breath sounds bilaterally, no wheezing, rales,rhonchi or crepitation. No use of accessory muscles of respiration.   CARDIOVASCULAR: S1, S2 normal. No murmurs, rubs, or gallops.  ABDOMEN: Soft, nontender, nondistended. Bowel sounds present. No organomegaly or mass.  EXTREMITIES: No pedal edema, cyanosis, or clubbing.  NEUROLOGIC: Cranial nerves II through XII are intact. Muscle strength 5/5 in all extremities. Sensation intact. Gait not checked.  PSYCHIATRIC: The patient is alert and oriented x 3.  SKIN: No obvious rash, lesion, or ulcer.   ORDERS/RESULTS REVIEWED:   CBC  Recent Labs Lab 12/22/2015 1847 12/21/15 0228  WBC 10.3 9.4  HGB 12.6* 11.5*  HCT 37.7* 34.7*  PLT 156 146*  MCV 92.0 92.1  MCH 30.8 30.5  MCHC 33.5 33.1  RDW 15.2* 15.5*  LYMPHSABS 0.7*  --   MONOABS 1.0  --   EOSABS 0.1  --   BASOSABS 0.0  --    ------------------------------------------------------------------------------------------------------------------  Chemistries   Recent Labs Lab 12/22/2015 1847 12/21/15 0228 12/21/15 1015 12/21/15 1016  NA 136 136  --   --   K 5.0 5.3*  --  5.6*  CL 102 106  --   --   CO2 18* 18*  --   --   GLUCOSE 81 69  --   --   BUN 143* 132*  --   --   CREATININE 7.55* 7.20*  --   --   CALCIUM 8.9 8.3*  --   --   MG  --   --  2.0  --    ------------------------------------------------------------------------------------------------------------------ estimated creatinine clearance is 10.3 mL/min (by C-G formula based on Cr of 7.2). ------------------------------------------------------------------------------------------------------------------  Recent Labs  12/21/15 0228  TSH 5.529*    Cardiac Enzymes  Recent Labs Lab 12/25/2015 1847 12/21/15 0228 12/21/15 2549  TROPONINI  0.21* 0.25* 0.23*   ------------------------------------------------------------------------------------------------------------------ Invalid input(s): POCBNP ---------------------------------------------------------------------------------------------------------------  RADIOLOGY: Dg Chest 2  View  01/08/2016  CLINICAL DATA:  Repeated episodes of hypoglycemia. EXAM: CHEST  2 VIEW COMPARISON:  None. FINDINGS: Cardiomegaly. Mild vascular congestion. RIGHT pleural effusion. No overt failure or focal infiltrates. No areas of significant atelectasis. Indeterminate age lower thoracic compression deformity. Osteopenia. IMPRESSION: Cardiomegaly with mild vascular congestion. RIGHT pleural effusion. No overt failure or infiltrates. Electronically Signed   By: Staci Righter M.D.   On: 12/17/2015 19:31    EKG:  Orders placed or performed during the hospital encounter of 12/22/2015  . ED EKG  . ED EKG  . EKG 12-Lead  . EKG 12-Lead  . EKG 12-Lead  . EKG 12-Lead    ASSESSMENT AND PLAN:  Principal Problem:   Hypoglycemia Active Problems:   Chronic a-fib ([facility])   Elevated troponin   Type 2 diabetes mellitus ([facility])   HTN (hypertension)   HLD (hyperlipidemia)   ESRD (end stage renal disease) ([facility])  #1 hypoglycemia, likely due to lingering diabetic medications due to worsening renal failure, continue D10 solution was intermittent D50 injections as needed, patient is being transferred to stepdown unit for closer supervision/observation #2. Elevated troponin, likely due to demand ischemia, patient denies any chest pain, following troponin, initiate a regular , continue  Aspirin, heparin subcutaneously, get cardiologist involved for further recommendations #3. Hyperkalemia, due to worsening renal failure, patient was given insulin with dextrose, initiate him on bicarbonate drip and give Kayexalate, to check potassium level later today, get nephrologist involved for further recommendations, possibly temporal hemodialysis catheter placement and dialysis if all medical measures fail, discussed this patient's family #4. CK D stage V, nephrology consultation is obtained, patient will likely need that to have a temporary hemodialysis catheter placed and hemodialysis initiated during this admission #5,  anorexia, likely CK D related, supportive therapy for now. Dietary consultation. Again, patient may need to have hemodialysis initiated during this admission, Discussed this patient's family extensively,    Management plans discussed with the patient, family and they are in agreement.   DRUG ALLERGIES: No Known Allergies  CODE STATUS:     Code Status Orders        Start     Ordered   12/18/2015 2317  Full code   Continuous     12/28/2015 2316    Code Status History    Date Active Date Inactive Code Status Order ID Comments User Context   This patient has a current code status but no historical code status.    Advance Directive Documentation        Most Recent Value   Type of Advance Directive  Healthcare Power of Attorney   Pre-existing out of facility DNR order (yellow form or pink MOST form)     "MOST" Form in Place?        TOTAL TIME TAKING CARE OF THIS PATIENT: 50 minutes.   I met with patient's son-in-law and discussed his case for approximately 15 minutes.   Theodoro Grist M.D on 12/21/2015 at 1:46 PM  Between 7am to 6pm - Pager - [phone]  After 6pm go to [email or web address] - password [email or web address] [email or web address]  [facility]  Office  [phone]  CC: Primary care physician; No primary care provider on file.

## 2015-12-21 NOTE — Progress Notes (Signed)
Pt may travel to Ultrasound without a Registered Nurse present

## 2015-12-21 NOTE — Consult Note (Signed)
[facility] CONSULT NOTE    Date: 12/21/2015                  Patient Name:  Miguel Guerrero  MRN: [id number]  DOB: 1933/06/21  Age / Sex: 82 y.o., male         PCP: No primary care provider on file.                 Service Requesting Consult: Dr. Jannifer Franklin                 Reason for Consult: Acute renal failure, CKD stage IV            History of Present Illness: Patient is a 82 y.o. male with a PMHx of chronic atrial fibrillation, diabetes mellitus type 2, gout, hypertension, chronic kidney disease stage IV followed by [facility], who was admitted to [facility] on 12/18/2015 for evaluation of mental status in the setting of hypoglycemia.  Patient is a poor historian and cannot offer significant details regarding the history of present illness.  I talked with the patient's son and alsoreviewed the chart thoroughly.   The patient was apparently found at home by his children.  He appeared confused.  He was found to be hypoglycemic with blood sugars in the 40s.  The patient's wife was recently admittedto the hospital.  He apparently stayed with her for 5 days and his children noticed a difference in him at that point in time.  He subsequently went home and it appears that he's not been eating or drinking very well.  The patient is followed by Dr. Posey Pronto of [facility] in [place].  His baseline EGFR ranges between 19-22.  urrently has severe acute renal failure with a BUN of 132 and creatinine of 7.2.  He is currently on a D10 drip as well as sodium bicarbonate infusion.   Medications: Outpatient medications: Prescriptions prior to admission  Medication Sig Dispense Refill Last Dose  . allopurinol (ZYLOPRIM) 300 MG tablet Take 300 mg by mouth daily.   01/03/2016 at Unknown time  . aspirin EC 81 MG tablet Take 81 mg by mouth daily.   Past Week at Unknown time  . digoxin (LANOXIN) 0.125 MG tablet Take 0.125 mg by mouth daily.   01/06/2016 at 1300  . furosemide (LASIX) 20 MG  tablet Take 20 mg by mouth daily.   01/04/2016 at Unknown time  . glimepiride (AMARYL) 4 MG tablet Take 6 mg by mouth daily with breakfast.   12/21/2015 at Unknown time  . insulin glargine (LANTUS) 100 UNIT/ML injection Inject 50 Units into the skin daily.    12/19/2015 at Unknown time  . pioglitazone (ACTOS) 45 MG tablet Take 45 mg by mouth daily.   12/30/2015 at Unknown time  . simvastatin (ZOCOR) 40 MG tablet Take 40 mg by mouth daily.    12/15/2015 at Unknown time  . torsemide (DEMADEX) 20 MG tablet Take 20 mg by mouth daily.   12/21/2015 at Unknown time  . Vitamin D, Ergocalciferol, (DRISDOL) 50000 units CAPS capsule Take 50,000 Units by mouth every 7 (seven) days. Pt takes on Monday.   12/19/2015 at Unknown time    Current medications: Current Facility-Administered Medications  Medication Dose Route Frequency Provider Last Rate Last Dose  . acetaminophen (TYLENOL) tablet 650 mg  650 mg Oral Q6H PRN Lance Coon, MD       Or  . acetaminophen (TYLENOL) suppository 650 mg  650 mg Rectal Q6H PRN  Lance Coon, MD      . aspirin EC tablet 81 mg  81 mg Oral Daily Lance Coon, MD   81 mg at 12/21/15 0944  . dextrose 10 % infusion   Intravenous Continuous Theodoro Grist, MD 125 mL/hr at 12/21/15 1113    . dextrose 50 % solution 50 mL  1 ampule Intravenous PRN Lance Coon, MD   25 mL at 12/21/15 0956  . diltiazem (CARDIZEM) tablet 60 mg  60 mg Oral 4 times per day [place], [place]      . heparin injection 5,000 Units  5,000 Units Subcutaneous 3 times per day Lance Coon, MD   5,000 Units at 12/21/15 1454  . ondansetron (ZOFRAN) tablet 4 mg  4 mg Oral Q6H PRN Lance Coon, MD       Or  . ondansetron [facility]) injection 4 mg  4 mg Intravenous Q6H PRN Lance Coon, MD      . simvastatin (ZOCOR) tablet 40 mg  40 mg Oral Daily Lance Coon, MD   40 mg at 12/21/15 0944  . sodium bicarbonate 150 mEq in dextrose 5 % 1,000 mL infusion   Intravenous Continuous Theodoro Grist, MD 40 mL/hr at 12/21/15 1230    .  sodium chloride flush (NS) 0.9 % injection 3 mL  3 mL Intravenous Q12H Lance Coon, MD   3 mL at 12/21/15 1000      Allergies: No Known Allergies    Past Medical History: Past Medical History  Diagnosis Date  . Chronic atrial fibrillation ([place])   . Type 2 diabetes mellitus ([place])   . Gout   . HTN (hypertension)   . HLD (hyperlipidemia)   . ESRD (end stage renal disease) [facility])      Past Surgical History: Past Surgical History  Procedure Laterality Date  . Cholecystectomy       Family History: Family History  Problem Relation Age of Onset  . Breast cancer Mother      Social History: Social History   Social History  . Marital Status: Married    Spouse Name: N/A  . Number of Children: N/A  . Years of Education: N/A   Occupational History  . Not on file.   Social History Main Topics  . Smoking status: Former Smoker    Quit date: 11/12/1968  . Smokeless tobacco: Never Used  . Alcohol Use: No  . Drug Use: No  . Sexual Activity: Not Currently   Other Topics Concern  . Not on file   Social History Narrative     Review of Systems: Unable to offer due to confusion  Vital Signs: Blood pressure 148/64, pulse 100, temperature 98 F (36.7 C), temperature source Oral, resp. rate 17, height 6' 1" (1.854 m), weight 107.548 kg (237 lb 1.6 oz), SpO2 95 %.  Weight trends: Filed Weights   12/25/2015 1841 12/14/2015 2300  Weight: 108.863 kg (240 lb) 107.548 kg (237 lb 1.6 oz)    Physical Exam: General: NAD, dissheveled  Head: Normocephalic, atraumatic.  Eyes: Anicteric, EOMI  Nose: Mucous membranes dry, not inflammed, nonerythematous.  Throat: Oropharynx nonerythematous, no exudate appreciated.   Neck: Supple, trachea midline.  Lungs:  Normal respiratory effort. Clear to auscultation BL without crackles or wheezes.  Heart: Irregular, S1S2, no obvious rub  Abdomen:  BS normoactive. Soft, Nondistended, non-tender.  No masses or organomegaly.  Extremities: No  pretibial edema.  Neurologic: Awake, confused, will follow simple commands  Skin: No visible rashes, scars.    Lab  results: Basic Metabolic Panel:  Recent Labs Lab 12/24/2015 1847 12/21/15 0228 12/21/15 1015 12/21/15 1016 12/21/15 1431  NA 136 136  --   --   --   K 5.0 5.3*  --  5.6* 5.5*  CL 102 106  --   --   --   CO2 18* 18*  --   --   --   GLUCOSE 81 69  --   --   --   BUN 143* 132*  --   --   --   CREATININE 7.55* 7.20*  --   --   --   CALCIUM 8.9 8.3*  --   --   --   MG  --   --  2.0  --   --     Liver Function Tests: No results for input(s): AST, ALT, ALKPHOS, BILITOT, PROT, ALBUMIN in the last 168 hours. No results for input(s): LIPASE, AMYLASE in the last 168 hours. No results for input(s): AMMONIA in the last 168 hours.  CBC:  Recent Labs Lab 12/16/2015 1847 12/21/15 0228  WBC 10.3 9.4  NEUTROABS 8.5*  --   HGB 12.6* 11.5*  HCT 37.7* 34.7*  MCV 92.0 92.1  PLT 156 146*    Cardiac Enzymes:  Recent Labs Lab 12/18/2015 1847 12/21/15 0228 12/21/15 0835 12/21/15 1431  TROPONINI 0.21* 0.25* 0.23* 0.16*    BNP: Invalid input(s): POCBNP  CBG:  Recent Labs Lab 12/21/15 0944 12/21/15 1020 12/21/15 1106 12/21/15 1256 12/21/15 1514  GLUCAP 60* 101* 83 95 87    Microbiology: No results found for this or any previous visit.  Coagulation Studies: No results for input(s): LABPROT, INR in the last 72 hours.  Urinalysis:  Recent Labs  01/03/2016 1847  COLORURINE STRAW*  LABSPEC 1.011  PHURINE 5.0  GLUCOSEU NEGATIVE  HGBUR NEGATIVE  BILIRUBINUR NEGATIVE  KETONESUR NEGATIVE  PROTEINUR NEGATIVE  NITRITE NEGATIVE  LEUKOCYTESUR NEGATIVE      Imaging: Dg Chest 2 View  01/03/2016  CLINICAL DATA:  Repeated episodes of hypoglycemia. EXAM: CHEST  2 VIEW COMPARISON:  None. FINDINGS: Cardiomegaly. Mild vascular congestion. RIGHT pleural effusion. No overt failure or focal infiltrates. No areas of significant atelectasis. Indeterminate age lower  thoracic compression deformity. Osteopenia. IMPRESSION: Cardiomegaly with mild vascular congestion. RIGHT pleural effusion. No overt failure or infiltrates. Electronically Signed   By: Staci Righter M.D.   On: 01/07/2016 19:31      Assessment & Plan: Pt is a 82 y.o. male with a PMHx of chronic atrial fibrillation, diabetes mellitus type 2, gout, hypertension, chronic kidney disease stage IV followed by [facility], who was admitted to [facility] on 12/22/2015 for evaluation of mental status in the setting of hypoglycemia.  1.  Acute renal failure/chronic kidney disease stage IV baseline EGFR 19-22 followed by Dr. Posey Pronto at [facility].  Acute renal failure likely related to poor by mouth intake that was prolonged at home. -  The patient has advanced renal dysfunction at baseline with EGFR ranging between 19-22.  Patient was having discussions with Dr. Posey Pronto regarding the potential for renal replacement therapy as an outpatient.  No urgent indication to immediately start dialysis however if his confusion persists into tomorrow we will consider a trial of hemodialysis for the acute component.  We will check renal ultrasound, SPEP, UPEP, ANA, ANCA antibodies, GBM antibodies, C3, C4 for further workup.  Urinalysis appeared to be unremarkable.  2.  Hyperkalemia.  Serum potassium has come down a bit to 5.5.  Continue  to monitor closely for now.  No urgent indication for dialysis.  3.  Metabolic acidosis.  Most recent serum bicarbonate was 18.  Agree with IV bicarbonate drip.  4.  Anemia chronic kidney disease.  Hemoglobin currently 11.5.  No indication for Procrit at the moment.  5. Thanks fof consult.

## 2015-12-21 NOTE — Plan of Care (Signed)
Problem: Nutrition Goal: Patient maintains adequate hydration Outcome: Not Progressing Pt admitted from the ED with Hypoglycemia. On arrival pt BG 75 with D5NS @ 177mL/hr. Tried to encourage pt to have juice and crakers but he has had no appetite and drank very minimal. Pt A&O x2 and had tremors in his hands and feet. Pt son was a bedside. At midnight BG 37, MD notified 1 amp D50 given. 52min recheck was 176, and D5 was switched to D10W @ 164mL/hr. 30 mins later it was 102, the next several hours BG stayed in the 70-80s. Still tried to encourage juice consumption but pt does feel like he can drink it. Pt remains in Afib 80-100s, BP 120-150/40-50 on RA SpO2 > 90%

## 2015-12-22 ENCOUNTER — Inpatient Hospital Stay: Payer: Medicare Other

## 2015-12-22 ENCOUNTER — Encounter: Payer: Self-pay | Admitting: Student

## 2015-12-22 LAB — PROTEIN ELECTROPHORESIS, SERUM
A/G Ratio: 1 (ref 0.7–1.7)
ALBUMIN ELP: 2.8 g/dL — AB (ref 2.9–4.4)
ALPHA-1-GLOBULIN: 0.2 g/dL (ref 0.0–0.4)
ALPHA-2-GLOBULIN: 1.1 g/dL — AB (ref 0.4–1.0)
BETA GLOBULIN: 1 g/dL (ref 0.7–1.3)
GAMMA GLOBULIN: 0.5 g/dL (ref 0.4–1.8)
Globulin, Total: 2.8 g/dL (ref 2.2–3.9)
TOTAL PROTEIN ELP: 5.6 g/dL — AB (ref 6.0–8.5)

## 2015-12-22 LAB — BASIC METABOLIC PANEL
Anion gap: 11 (ref 5–15)
BUN: 100 mg/dL — AB (ref 6–20)
CALCIUM: 8.3 mg/dL — AB (ref 8.9–10.3)
CO2: 23 mmol/L (ref 22–32)
CREATININE: 5.4 mg/dL — AB (ref 0.61–1.24)
Chloride: 100 mmol/L — ABNORMAL LOW (ref 101–111)
GFR calc Af Amer: 10 mL/min — ABNORMAL LOW (ref >60)
GFR, EST NON AFRICAN AMERICAN: 9 mL/min — AB (ref >60)
GLUCOSE: 86 mg/dL (ref 65–99)
Potassium: 5.2 mmol/L — ABNORMAL HIGH (ref 3.5–5.1)
Sodium: 134 mmol/L — ABNORMAL LOW (ref 135–145)

## 2015-12-22 LAB — MPO/PR-3 (ANCA) ANTIBODIES
ANCA Proteinase 3: <3.5 U/mL (ref 0.0–3.5)
Myeloperoxidase Abs: <9 U/mL (ref 0.0–9.0)

## 2015-12-22 LAB — CBC
HCT: 36.3 % — ABNORMAL LOW (ref 40.0–52.0)
Hemoglobin: 12.2 g/dL — ABNORMAL LOW (ref 13.0–18.0)
MCH: 30.9 pg (ref 26.0–34.0)
MCHC: 33.7 g/dL (ref 32.0–36.0)
MCV: 91.7 fL (ref 80.0–100.0)
PLATELETS: 151 10*3/uL (ref 150–440)
RBC: 3.96 MIL/uL — ABNORMAL LOW (ref 4.40–5.90)
RDW: 15.4 % — AB (ref 11.5–14.5)
WBC: 12.7 10*3/uL — ABNORMAL HIGH (ref 3.8–10.6)

## 2015-12-22 LAB — PROTEIN ELECTRO, RANDOM URINE
ALBUMIN ELP UR: 23.9 %
ALPHA-1-GLOBULIN, U: 1.2 %
Alpha-2-Globulin, U: 17.5 %
BETA GLOBULIN, U: 41.7 %
GAMMA GLOBULIN, U: 15.8 %
M Component, Ur: 15.8 % — ABNORMAL HIGH
TOTAL PROTEIN, URINE-UPE24: 4 mg/dL

## 2015-12-22 LAB — GLUCOSE, CAPILLARY
GLUCOSE-CAPILLARY: 112 mg/dL — AB (ref 65–99)
GLUCOSE-CAPILLARY: 128 mg/dL — AB (ref 65–99)
GLUCOSE-CAPILLARY: 177 mg/dL — AB (ref 65–99)
GLUCOSE-CAPILLARY: 75 mg/dL (ref 65–99)
GLUCOSE-CAPILLARY: 85 mg/dL (ref 65–99)
GLUCOSE-CAPILLARY: 91 mg/dL (ref 65–99)
GLUCOSE-CAPILLARY: 92 mg/dL (ref 65–99)
Glucose-Capillary: 61 mg/dL — ABNORMAL LOW (ref 65–99)
Glucose-Capillary: 101 mg/dL — ABNORMAL HIGH (ref 65–99)
Glucose-Capillary: 96 mg/dL (ref 65–99)
Glucose-Capillary: 77 mg/dL (ref 65–99)
Glucose-Capillary: 68 mg/dL (ref 65–99)
Glucose-Capillary: 166 mg/dL — ABNORMAL HIGH (ref 65–99)

## 2015-12-22 LAB — ANA W/REFLEX IF POSITIVE: ANA: NEGATIVE

## 2015-12-22 LAB — PARATHYROID HORMONE, INTACT (NO CA): PTH: 119 pg/mL — AB (ref 15–65)

## 2015-12-22 LAB — C3 COMPLEMENT: C3 Complement: 134 mg/dL (ref 82–167)

## 2015-12-22 LAB — VITAMIN D 25 HYDROXY (VIT D DEFICIENCY, FRACTURES): VIT D 25 HYDROXY: 43.6 ng/mL (ref 30.0–100.0)

## 2015-12-22 LAB — C4 COMPLEMENT: COMPLEMENT C4, BODY FLUID: 36 mg/dL (ref 14–44)

## 2015-12-22 MED ORDER — DEXTROSE 50 % IV SOLN
25.0000 mL | Freq: Once | INTRAVENOUS | Status: AC
  Administered 2015-12-22: 25 mL via INTRAVENOUS
  Filled 2015-12-22: qty 50

## 2015-12-22 MED ORDER — DILTIAZEM HCL 25 MG/5ML IV SOLN
10.0000 mg | Freq: Four times a day (QID) | INTRAVENOUS | Status: DC
  Administered 2015-12-22 – 2015-12-23 (×4): 10 mg via INTRAVENOUS
  Filled 2015-12-22 (×4): qty 5

## 2015-12-22 MED ORDER — ONDANSETRON HCL 4 MG/2ML IJ SOLN
4.0000 mg | Freq: Four times a day (QID) | INTRAMUSCULAR | Status: DC
  Administered 2015-12-22 – 2015-12-23 (×4): 4 mg via INTRAVENOUS
  Filled 2015-12-22 (×5): qty 2

## 2015-12-22 MED ORDER — ATORVASTATIN CALCIUM 20 MG PO TABS
40.0000 mg | ORAL_TABLET | Freq: Every day | ORAL | Status: DC
  Administered 2015-12-23 – 2015-12-25 (×3): 40 mg via ORAL
  Filled 2015-12-22 (×3): qty 2

## 2015-12-22 MED ORDER — METOPROLOL TARTRATE 1 MG/ML IV SOLN: 5.0000 mg | Freq: Four times a day (QID) | INTRAVENOUS | Status: DC | PRN

## 2015-12-22 MED ORDER — PANTOPRAZOLE SODIUM 40 MG IV SOLR
40.0000 mg | Freq: Two times a day (BID) | INTRAVENOUS | Status: DC
  Administered 2015-12-22 – 2015-12-23 (×2): 40 mg via INTRAVENOUS
  Filled 2015-12-22 (×2): qty 40

## 2015-12-22 MED ORDER — METOPROLOL TARTRATE 1 MG/ML IV SOLN
10.0000 mg | Freq: Four times a day (QID) | INTRAVENOUS | Status: DC | PRN
  Administered 2015-12-22 (×2): 10 mg via INTRAVENOUS
  Filled 2015-12-22 (×2): qty 10

## 2015-12-22 NOTE — Progress Notes (Signed)
Patient with foul smelling vomitus and denies abdominal symptoms.  Patient has had poor PO intake prior to admission and during admission.  Patient with loose stool for night shift.  Dr. Tressia Miners called and notified of all problems and orders obtained.

## 2015-12-22 NOTE — Progress Notes (Signed)
[facility]  ROUNDING NOTE   Subjective:  Pt seen at bedside. Stil having some periods of low blood pressure.  UOP 1 liter over the preceding 24 hours, 1.5 liters over the last 3 shifts.     Objective:  Vital signs in last 24 hours:  Temp:  [98 F (36.7 C)-99.6 F (37.6 C)] 99.6 F (37.6 C) (02/09 0800) Pulse Rate:  [99-142] 106 (02/09 0800) Resp:  [13-23] 22 (02/09 0800) BP: (111-152)/(52-86) 127/86 mmHg (02/09 0800) SpO2:  [93 %-99 %] 96 % (02/09 0800)  Weight change:  Filed Weights   12/31/2015 1841 01/04/2016 2300  Weight: 108.863 kg (240 lb) 107.548 kg (237 lb 1.6 oz)    Intake/Output: I/O last 3 completed shifts: In: 4422.9 [P.O.:75; I.V.:4347.9] Out: 1575 [Urine:1575]   Intake/Output this shift:  Total I/O In: 165 [I.V.:165] Out: -   Physical Exam: General: NAD, disheveled  Head: Normocephalic, atraumatic. Moist oral mucosal membranes  Eyes: Anicteric  Neck: Supple, trachea midline  Lungs:  Clear to auscultation normal effort  Heart: irregular  Abdomen:  Soft, nontender, BS present  Extremities:  trace peripheral edema.  Neurologic: Nonfocal, moving all four extremities  Skin: No lesions       Basic Metabolic Panel:  Recent Labs Lab 12/14/2015 1847 12/21/15 0228 12/21/15 1015 12/21/15 1016 12/21/15 1431 12/22/15 0643  NA 136 136  --   --   --  134*  K 5.0 5.3*  --  5.6* 5.5* 5.2*  CL 102 106  --   --   --  100*  CO2 18* 18*  --   --   --  23  GLUCOSE 81 69  --   --   --  86  BUN 143* 132*  --   --   --  100*  CREATININE 7.55* 7.20*  --   --   --  5.40*  CALCIUM 8.9 8.3*  --   --   --  8.3*  MG  --   --  2.0  --   --   --     Liver Function Tests: No results for input(s): AST, ALT, ALKPHOS, BILITOT, PROT, ALBUMIN in the last 168 hours. No results for input(s): LIPASE, AMYLASE in the last 168 hours. No results for input(s): AMMONIA in the last 168 hours.  CBC:  Recent Labs Lab 01/10/2016 1847 12/21/15 0228 12/22/15 0643   WBC 10.3 9.4 12.7*  NEUTROABS 8.5*  --   --   HGB 12.6* 11.5* 12.2*  HCT 37.7* 34.7* 36.3*  MCV 92.0 92.1 91.7  PLT 156 146* 151    Cardiac Enzymes:  Recent Labs Lab 12/30/2015 1847 12/21/15 0228 12/21/15 0835 12/21/15 1431  TROPONINI 0.21* 0.25* 0.23* 0.16*    BNP: Invalid input(s): POCBNP  CBG:  Recent Labs Lab 12/22/15 0004 12/22/15 0309 12/22/15 0433 12/22/15 0558 12/22/15 0758  GLUCAP 85 75 101* 96 91    Microbiology: Results for orders placed or performed during the hospital encounter of 12/24/2015  MRSA PCR Screening     Status: None   Collection Time: 12/21/15  5:51 PM  Result Value Ref Range Status   MRSA by PCR NEGATIVE NEGATIVE Final    Comment:        The GeneXpert MRSA Assay (FDA approved for NASAL specimens only), is one component of a comprehensive MRSA colonization surveillance program. It is not intended to diagnose MRSA infection nor to guide or monitor treatment for MRSA infections.     Coagulation Studies:  No results for input(s): LABPROT, INR in the last 72 hours.  Urinalysis:  Recent Labs  12/17/2015 1847  COLORURINE STRAW*  LABSPEC 1.011  PHURINE 5.0  GLUCOSEU NEGATIVE  HGBUR NEGATIVE  BILIRUBINUR NEGATIVE  KETONESUR NEGATIVE  PROTEINUR NEGATIVE  NITRITE NEGATIVE  LEUKOCYTESUR NEGATIVE      Imaging: Dg Chest 2 View  12/31/2015  CLINICAL DATA:  Repeated episodes of hypoglycemia. EXAM: CHEST  2 VIEW COMPARISON:  None. FINDINGS: Cardiomegaly. Mild vascular congestion. RIGHT pleural effusion. No overt failure or focal infiltrates. No areas of significant atelectasis. Indeterminate age lower thoracic compression deformity. Osteopenia. IMPRESSION: Cardiomegaly with mild vascular congestion. RIGHT pleural effusion. No overt failure or infiltrates. Electronically Signed   By: Staci Righter M.D.   On: 01/03/2016 19:31     Medications:   . dextrose 125 mL/hr at 12/21/15 2000  .  sodium bicarbonate  infusion 1000 mL 40 mL/hr at  12/21/15 2000   . aspirin EC  81 mg Oral Daily  . diltiazem  60 mg Oral 4 times per day  . heparin  5,000 Units Subcutaneous 3 times per day  . simvastatin  40 mg Oral Daily  . sodium chloride flush  3 mL Intravenous Q12H   acetaminophen **OR** acetaminophen, dextrose, metoprolol, ondansetron **OR** ondansetron (ZOFRAN) IV  Assessment/ Plan:  82 y.o. male with a PMHx of chronic atrial fibrillation, diabetes mellitus type 2, gout, hypertension, chronic kidney disease stage IV followed by [facility], who was admitted to [facility] on 12/29/2015 for evaluation of mental status in the setting of hypoglycemia.  1. Acute renal failure/chronic kidney disease stage IV baseline EGFR 19-22 followed by Dr. Posey Pronto at [facility]. Acute renal failure likely related to poor by mouth intake that was prolonged at home. - Significant renal function improvement noted, good UOP, no urgent indication for HD at the moment, continue IVF hydration, monitor renal function parameters.  Awaiting serologic w/u as well as renal US.  2. Hyperkalemia. K down to 5.2, continue to monitor K.  3. Metabolic acidosis. Serum bicarb up to 23, continue bicarb gtt one additional day.  4. Anemia chronic kidney disease. Hgb currently 12.2, no indication for procrit.   LOS: 2 Miguel Guerrero 2/9/20179:15 AM

## 2015-12-22 NOTE — Progress Notes (Signed)
Initial Nutrition Assessment     INTERVENTION:   Meals and Snacks: Cater to patient preferences; may benefit from liberalizing diet; smaller more frequent meals Medical Food Supplement Therapy: once able to tolerate po, recommend addition of nutritional supplement   NUTRITION DIAGNOSIS:   Inadequate oral intake related to vomiting, poor appetite as evidenced by meal completion < 25%, per patient/family report.  GOAL:   Patient will meet greater than or equal to 90% of their needs  MONITOR:    (Energy Intake, Anthropometrics, Digestive System, Glucose Profile, Electrolyte/Renal Profile)  REASON FOR ASSESSMENT:   Consult Assessment of nutrition requirement/status  ASSESSMENT:    Pt admitted with hypoglycemia with confusion and decreased responsiveness; pt on D10 infussion and still having hypogylcemic episodes, pt also with acute on CKD IV, anorexia likely related to renal function  Past Medical History  Diagnosis Date  . Chronic atrial fibrillation (HCC)     a. CHA2DS2-VASc Score is 4 (HTN, DM, Age (2)) On Coumadin in the past, not currently.  . Type 2 diabetes mellitus ([place])   . Gout   . HTN (hypertension)   . HLD (hyperlipidemia)   . ESRD (end stage renal disease) ([place])     Diet Order:  Diet heart healthy/carb modified Room service appropriate?: Yes; Fluid consistency:: Thin   Energy Intake: recorded po intake 0% at breakfast yesterday, no other po intake recorded. Pt unable to tolerate po intake today due to vomiting  Food and Nutrition Related History: family at bedside and reports appetite has been down for a while although pt has been eating better than he is in the hospital. Pt cares for his wife and she was recently hospitalized for 5 days and pt stayed with her is hospital; family would bring in him food but he would barely eat half of that. Prior to this, pt still with poor appetite  Digestive System: +vomiting today with foul smelling odor without nausea or  abdominal pain; +black BMs; no N/V prior to admission. No reported problems chewing or swallowing by family  Skin:  Reviewed, no issues  Last BM:  12/22/15   Electrolyte and Renal Profile:  Recent Labs Lab 12/24/2015 1847 12/21/15 0228 12/21/15 1015 12/21/15 1016 12/21/15 1431 12/22/15 0643  BUN 143* 132*  --   --   --  100*  CREATININE 7.55* 7.20*  --   --   --  5.40*  NA 136 136  --   --   --  134*  K 5.0 5.3*  --  5.6* 5.5* 5.2*  MG  --   --  2.0  --   --   --    Glucose Profile:   Recent Labs  12/22/15 0946 12/22/15 1155 12/22/15 1400  GLUCAP 77 92 68   Protein Profile: No results for input(s): ALBUMIN in the last 168 hours. Nutritional Anemia Profile:  CBC Latest Ref Rng 12/22/2015 12/21/2015 12/18/2015  WBC 3.8 - 10.6 K/uL 12.7(H) 9.4 10.3  Hemoglobin 13.0 - 18.0 g/dL 12.2(L) 11.5(L) 12.6(L)  Hematocrit 40.0 - 52.0 % 36.3(L) 34.7(L) 37.7(L)  Platelets 150 - 440 K/uL 151 146(L) 156    Meds: D10 at 125 ml/hr, sodium bicarb drip at 40 ml/hr  Nutrition Focused Physical Exam:  Unable to complete Nutrition-Focused physical exam at this time.    Height:   Ht Readings from Last 1 Encounters:  12/18/2015 6\' 1"  (1.854 m)    Weight: family reports pt weighed 232 pounds at MD office 5-6 weeks ago, wt at that  time was down a few pounds  Wt Readings from Last 1 Encounters:  01/05/2016 237 lb 1.6 oz (107.548 kg)    BMI:  Body mass index is 31.29 kg/(m^2).  Estimated Nutritional Needs:   Kcal:  [id number] kcals (BEE 1594, 1.2 AF, 1.1-1.3 IF) using IBW 84 kg  Protein:  84-101 g (1.0-1.2 g/kg)   Fluid:  2100-2520 (25-30 ml/kg)     HIGH Care Level  Kerman Passey MS, RD, LDN [phone] Pager  [phone] Weekend/On-Call Pager

## 2015-12-22 NOTE — Progress Notes (Signed)
[facility] [place] NAME: Miguel Guerrero    MR#:  [id number]  DATE OF BIRTH:  08-19-1933  SUBJECTIVE:  CHIEF COMPLAINT:   Chief Complaint  Patient presents with  . Hypoglycemia   - admitted with hypoglycemia, denies any fall - nausea, vomitng twice since last night, no abdominal pain - improved renal failure, cr still >5, urine output of 1000 cc over 24hrs  REVIEW OF SYSTEMS:  Review of Systems  Constitutional: Negative for fever and chills.  HENT: Negative for ear discharge and ear pain.   Eyes: Negative for blurred vision.  Respiratory: Negative for cough, shortness of breath and wheezing.   Cardiovascular: Negative for chest pain and palpitations.  Gastrointestinal: Positive for nausea and vomiting. Negative for abdominal pain, diarrhea and constipation.  Genitourinary: Negative for dysuria.  Musculoskeletal: Positive for myalgias and joint pain.       Left elbow pain and swelling  Neurological: Positive for weakness. Negative for dizziness, speech change, focal weakness, seizures and headaches.  Psychiatric/Behavioral: Negative for depression.    DRUG ALLERGIES:  No Known Allergies  VITALS:  Blood pressure 114/64, pulse 95, temperature 99 F (37.2 C), temperature source Axillary, resp. rate 16, height 6\' 1"  (1.854 m), weight 107.548 kg (237 lb 1.6 oz), SpO2 98 %.  PHYSICAL EXAMINATION:  Physical Exam  GENERAL:  82 y.o.-year-old patient lying in the bed with no acute distress.  EYES: Pupils equal, round, reactive to light and accommodation. No scleral icterus. Extraocular muscles intact.  HEENT: Head atraumatic, normocephalic. Oropharynx and nasopharynx clear.  NECK:  Supple, no jugular venous distention. No thyroid enlargement, no tenderness.  LUNGS: Normal breath sounds bilaterally, no wheezing, rales,rhonchi or crepitation. No use of accessory muscles of respiration. Decreased bibasilar breath sounds CARDIOVASCULAR:  S1, S2 normal. No rubs, or gallops. 3/6 systolic murmur present ABDOMEN: Soft, nontender, nondistended. Bowel sounds present. No organomegaly or mass.  EXTREMITIES: No cyanosis, or clubbing. 1+ extremity edema NEUROLOGIC: Cranial nerves II through XII are intact. Left elbow pain and swelling. Sensation intact. Gait not checked.  PSYCHIATRIC: The patient is alert and oriented x 2-3.  SKIN: No obvious rash, lesion, or ulcer.    LABORATORY PANEL:   CBC  Recent Labs Lab 12/22/15 0643  WBC 12.7*  HGB 12.2*  HCT 36.3*  PLT 151   ------------------------------------------------------------------------------------------------------------------  Chemistries   Recent Labs Lab 12/21/15 1015  12/22/15 0643  NA  --   --  134*  K  --   < > 5.2*  CL  --   --  100*  CO2  --   --  23  GLUCOSE  --   --  86  BUN  --   --  100*  CREATININE  --   --  5.40*  CALCIUM  --   --  8.3*  MG 2.0  --   --   < > = values in this interval not displayed. ------------------------------------------------------------------------------------------------------------------  Cardiac Enzymes  Recent Labs Lab 12/21/15 1431  TROPONINI 0.16*   ------------------------------------------------------------------------------------------------------------------  RADIOLOGY:  Ct Abdomen Pelvis Wo Contrast  12/22/2015  CLINICAL DATA:  Foul-smelling vomitus. Diarrhea and poor appetite. History of chronic renal disease. EXAM: CT ABDOMEN AND PELVIS WITHOUT CONTRAST TECHNIQUE: Multidetector CT imaging of the abdomen and pelvis was performed following the standard protocol without IV contrast. COMPARISON:  None. FINDINGS: Lower chest: Nonspecific peribronchovascular and subpleural interstitial thickening and reticulation, incompletely evaluated due to motion artifact. There is calcified atherosclerotic disease of  the coronary arteries. Aortic valve annulus calcifications are also seen. Hepatobiliary: No mass visualized on  this un-enhanced exam. Sub cm right hepatic dome cyst. Status post prior cholecystectomy. Pancreas: No mass or inflammatory process identified on this un-enhanced exam. Spleen: Within normal limits in size. Adrenals/Urinary Tract: There is left renal atrophy and bilateral perirenal fat stranding. Stomach/Bowel: No evidence of obstruction, inflammatory process, or abnormal fluid collections. There are 2 probable duodenal diverticula (image 42 sequence 2) cysts. Vascular/Lymphatic: No pathologically enlarged lymph nodes. No evidence of abdominal aortic aneurysm. Reproductive: No mass or other significant abnormality. Prostatic gland calcifications are noted, nonspecific finding. Other: Mild nonspecific mesenteric stranding. Musculoskeletal: No suspicious bone lesions identified. There are multilevel osteoarthritic changes of the thoracic and lumbosacral spine. Likely degenerative compression deformity with approximately 40% height loss of T12 vertebral body is seen. Milder inferior endplate compression deformity of L2 vertebral body is also noted. IMPRESSION: No evidence of small-bowel obstruction or significant inflammatory changes. Non specific mesenteric stranding. Left renal atrophy and bilateral perinephric stranding, consistent with the given history of chronic renal failure. Otherwise, no evidence of significant abnormalities of the solid abdominal organs, accounting for lack of IV contrast. Focal duodenal prominence likely represents 2 duodenal diverticula. Poorly evaluated peribronchovascular and subpleural interstitial thickening and reticulation in the lower lobes of the lungs. This may represent an element of chronic interstitial lung disease. Electronically Signed   By: Fidela Salisbury M.D.   On: 12/22/2015 14:58   Dg Chest 2 View  12/24/2015  CLINICAL DATA:  Repeated episodes of hypoglycemia. EXAM: CHEST  2 VIEW COMPARISON:  None. FINDINGS: Cardiomegaly. Mild vascular congestion. RIGHT pleural  effusion. No overt failure or focal infiltrates. No areas of significant atelectasis. Indeterminate age lower thoracic compression deformity. Osteopenia. IMPRESSION: Cardiomegaly with mild vascular congestion. RIGHT pleural effusion. No overt failure or infiltrates. Electronically Signed   By: Staci Righter M.D.   On: 12/19/2015 19:31   Dg Elbow 2 Views Left  12/22/2015  CLINICAL DATA:  Left elbow pain, difficult to straighten. EXAM: LEFT ELBOW - 2 VIEW COMPARISON:  None. FINDINGS: An IV and associated Hep-Lock tubing project over the left elbow. There is a small elbow joint effusion, with the posterior fat pad minimally visible. Soft tissue swelling overlies the olecranon. There is mild spurring at the attachment site of the distal triceps. The supinator fat pad is indistinct and potentially effaced. There are some linear lucencies projecting along the radial head which are probably due to striations in the overlying soft tissue rather than the radial head fracture, although the supinator fat pad effacement can be associated with radial head fracture. No other significant bony findings. IMPRESSION: 1. Small elbow joint effusion with minimal visibility of the posterior fat pad along the bony margin. 2. Effaced supinator fat pad. This might be due to the underlying generalized edema in the subcutaneous tissues and within the region, but this can sometimes be associated with occult radial head fracture. 3. Soft tissue swelling overlying the olecranon, although this may be part of the generalized subcutaneous edema rather than necessarily indicating olecranon bursitis. Electronically Signed   By: Van Clines M.D.   On: 12/22/2015 12:13   US Renal  12/22/2015  CLINICAL DATA:  Acute renal failure of unknown etiology. EXAM: RENAL / URINARY TRACT ULTRASOUND COMPLETE COMPARISON:  Abdominal and pelvic noncontrast CT scan of December 22, 2015 FINDINGS: Right Kidney: Length: 12.9 cm. The renal cortical echotexture  remains lower than that of the adjacent liver. There is no  discrete mass. There is no hydronephrosis. Little vascular flow is observed with Doppler. Left Kidney: Length: 9.3 cm. There is cortical atrophy. The cortical echotexture is greater than that on the right. There is no hydronephrosis. Limited vascular flow is observed with Doppler. Bladder: Appears normal for degree of bladder distention. The prevoid volume is 406 cc. The postvoid volume is 127 cc. IMPRESSION: Parenchymal changes and atrophy consistent with medical renal disease on the left. The renal size and echotexture on the right appears normal. However, vascular flow appears diminished bilaterally. Moderate-sized postvoid residual urinary bladder volume. Electronically Signed   By: David  Martinique M.D.   On: 12/22/2015 15:04    EKG:   Orders placed or performed during the hospital encounter of 12/22/2015  . ED EKG  . ED EKG  . EKG 12-Lead  . EKG 12-Lead  . EKG 12-Lead  . EKG 12-Lead    ASSESSMENT AND PLAN:   82 y.o. male with a PMHx of chronic atrial fibrillation, diabetes mellitus type 2, gout, hypertension, chronic kidney disease stage IV followed by [place] [facility], who was admitted to [facility] on 12/24/2015 for evaluation of mental status in the setting of hypoglycemia.  1. Hypoglycemia- Likely poor PO intake and also with ARF - on D10 drip for now Since still nauseated and vomiting and on clears only- continue D10 drip for now and monitor sugars closely.  2. ARF on CKD stage 4- baseline gfr 20 per nephrology notes, now with fluids- improved to gfr 9 - cr improved from 7.2 to 5.4 - renal US with atrophic kidneys - no acute indication for dialysis now, but might be heading that way - appreciate nephrology consult - monitor BMP  3. Nausea/vomiting- CT abd with no obstruction ? Uremia with worsened renal function - add scheduled zofran, start protonix BID for now - clear liquids as tolerated  4. Hyperkalemia- Improving with  improving renal function  5. Left elbow pain- X ray with effusion, may be olecranon bursitis or radial head occult fracture- not seen on X ray - very tender to touch and also move\ - ortho consulted, keep it elevated and ice pack prn  6. Anemia of chronic disease- stable, monitor for now  7. Atrial fibrillation- now due to nausea/vomiting- will change to IV cardizem q6h and prn metoprolol as well and monitor on tele - hold off anticoagulation with warfarin due to dark vomitus and also melena. Also possible falling at home. - appreciate cardiology consult Monitor for now  ECHO with normal LV function, elevated troponin- likely from demand ischemia in the setting of hypoglycemia  8. DVT Prophylaxis- on SQ heparin   Physical therapy consult once more medically stable.  Updated daughter today.   All the records are reviewed and case discussed with Care Management/Social Workerr. Management plans discussed with the patient, family and they are in agreement.  CODE STATUS: Full Code  TOTAL CRITICAL CARE TIME SPENT IN TAKING CARE OF THIS PATIENT: 39 minutes.   POSSIBLE D/C IN 3-4 DAYS, DEPENDING ON CLINICAL CONDITION.   Kenney Going M.D on 12/22/2015 at 5:53 PM  Between 7am to 6pm - Pager - [phone]  After 6pm go to [email or web address] - password [email or web address] [facility]  [facility]  Office  [phone]  CC: Primary care physician; No primary care provider on file.

## 2015-12-22 NOTE — Progress Notes (Signed)
Hospital Problem List     Principal Problem:   Hypoglycemia Active Problems:   Chronic a-fib (HCC)   Elevated troponin   Type 2 diabetes mellitus (HCC)   HTN (hypertension)   HLD (hyperlipidemia)   ESRD (end stage renal disease) (HCC)   Acute renal failure [facility])     Patient Profile:   82 y.o. male w/ PMH of chronic atrial fibrillation (not on anticoagulation), Type 2 DM, HTN, HLD, and ESRD (not on dialysis) who presented to [facility] on 12/22/2015 for repeat hypoglycemic episodes associated with decreased responsiveness. Cards consulted for elevated troponin and atrial fibrillation.  Subjective   Reports he slept well overnight. Denies any chest pain, palpitations, or dyspnea. Seems more alert this morning compared to yesterday.  Inpatient Medications    . aspirin EC  81 mg Oral Daily  . diltiazem  60 mg Oral 4 times per day  . heparin  5,000 Units Subcutaneous 3 times per day  . simvastatin  40 mg Oral Daily  . sodium chloride flush  3 mL Intravenous Q12H    Vital Signs    Filed Vitals:   12/22/15 0400 12/22/15 0500 12/22/15 0600 12/22/15 0614  BP: 121/59 130/55 120/54 120/54  Pulse: 138 99 103   Temp: 98 F (36.7 C)     TempSrc: Oral     Resp: 21 20 22    Height:      Weight:      SpO2: 93% 96% 95%     Intake/Output Summary (Last 24 hours) at 12/22/15 0800 Last data filed at 12/22/15 0600  Gross per 24 hour  Intake 3769.58 ml  Output   1050 ml  Net 2719.58 ml   Filed Weights   01/09/2016 1841 12/18/2015 2300  Weight: 240 lb (108.863 kg) 237 lb 1.6 oz (107.548 kg)    Physical Exam    General: Well developed, well nourished, male in no acute distress. Head: Normocephalic, atraumatic.  Neck: Supple without bruits, JVD not elevated. Lungs:  Resp regular and unlabored, CTA without wheezing or rales. Heart: Irregularly irregular, S1, S2, no S3, S4, 2/6 SEM at LUSB; no rub. Abdomen: Soft, non-tender, non-distended with normoactive bowel sounds. No hepatomegaly.  No rebound/guarding. No obvious abdominal masses. Extremities: No clubbing or cyanosis, trace edema. Distal pedal pulses are 1+ bilaterally. Neuro: Alert and oriented X 3. Moves all extremities spontaneously. Psych: Normal affect.  Labs    CBC  Recent Labs  01/07/2016 1847 12/21/15 0228 12/22/15 0643  WBC 10.3 9.4 12.7*  NEUTROABS 8.5*  --   --   HGB 12.6* 11.5* 12.2*  HCT 37.7* 34.7* 36.3*  MCV 92.0 92.1 91.7  PLT 156 146* [id number]   Basic Metabolic Panel  Recent Labs  12/25/2015 1847 12/21/15 0228 12/21/15 1015 12/21/15 1016 12/21/15 1431  NA 136 136  --   --   --   K 5.0 5.3*  --  5.6* 5.5*  CL 102 106  --   --   --   CO2 18* 18*  --   --   --   GLUCOSE 81 69  --   --   --   BUN 143* 132*  --   --   --   CREATININE 7.55* 7.20*  --   --   --   CALCIUM 8.9 8.3*  --   --   --   MG  --   --  2.0  --   --    Liver Function Tests No  results for input(s): AST, ALT, ALKPHOS, BILITOT, PROT, ALBUMIN in the last 72 hours. No results for input(s): LIPASE, AMYLASE in the last 72 hours. Cardiac Enzymes  Recent Labs  12/21/15 0228 12/21/15 0835 12/21/15 1431  TROPONINI 0.25* 0.23* 0.16*   BNP Invalid input(s): POCBNP D-Dimer No results for input(s): DDIMER in the last 72 hours. Hemoglobin A1C  Recent Labs  12/21/15 0228  HGBA1C 9.4*   Fasting Lipid Panel No results for input(s): CHOL, HDL, LDLCALC, TRIG, CHOLHDL, LDLDIRECT in the last 72 hours. Thyroid Function Tests  Recent Labs  12/21/15 0228  TSH 5.529*    Telemetry    Atrial fibrillation, HR in 110's - 140's. No atopic events.  ECG    No new tracings.   Cardiac Studies and Radiology    Dg Chest 2 View: 12/28/2015  CLINICAL DATA:  Repeated episodes of hypoglycemia. EXAM: CHEST  2 VIEW COMPARISON:  None. FINDINGS: Cardiomegaly. Mild vascular congestion. RIGHT pleural effusion. No overt failure or focal infiltrates. No areas of significant atelectasis. Indeterminate age lower thoracic compression  deformity. Osteopenia. IMPRESSION: Cardiomegaly with mild vascular congestion. RIGHT pleural effusion. No overt failure or infiltrates. Electronically Signed   By: Staci Righter M.D.   On: 12/26/2015 19:31    Echocardiogram:  Study Conclusions  - Left ventricle: The cavity size was normal. There was mild concentric hypertrophy. Systolic function was vigorous. The estimated ejection fraction was in the range of 65% to 70%. Wall motion was normal; there were no regional wall motion abnormalities. - Aortic valve: moderately calcified leaflets. Transvalvular velocity was increased. There was very mild stenosis. Peak velocity (S): 234 cm/s. Peak gradient (S): 22 mm Hg. - Left atrium: The atrium was mildly dilated. - Right ventricle: Systolic function was normal. - Pulmonary arteries: Systolic pressure was mild to moderately elevated. PA peak pressure: 49 mm Hg (S).  Impressions:  - Rhythm suggestive of atrial fibrillation.  Assessment & Plan    1. Elevated Troponin  - Cyclic troponin values have been 0.21, 0.25, 0.23, and 0.16. - EKG shows atrial fibrillation with a HR 96 and nonspecific ST changes in the anterior leads. - the patient denies any history of CAD. Does not report any anginal symptoms.  - Echo shows EF of 65-70% with no wall motion abnormalities. Mild to moderate pulmonary HTN was noted. - likely to be due to demand ischemia in the setting of his ESRD, atrial fibrillation, and continued hypoglycemia. No ischemic evaluation indicated at this time.  2. Chronic Atrial Fibrillation - patient reports having this for "many years" and it is now managed by his PCP. - This patients CHA2DS2-VASc Score and unadjusted Ischemic Stroke Rate (% per year) is equal to 4.8 % stroke rate/year from a score of 4 (HTN, DM, Age (2)). He was on Coumadin in the past but this was discontinued for an unknown reason. Prior to his recent hypoglycemic episode, he denies any history of  frequent falls. The family is not aware of a history of GI bleeding. He would not be a good candidate for NOAC due to his ESRD, even with potential decreased dosage of Eliquis. Could consider restarting Coumadin following improvement in his illness or this could be addressed by his PCP as an outpatient since he might have more knowledge about the patient's medical history and why this was discontinued.  - was on Digoxin 0.125mg  daily and Cardizem CD 240mg  daily for rate control prior to admission. With his ESRD, Digoxin has been discontinued. His Cardizem was not  listed on the Home Medications yet his son provided a picture of the medication saying he takes it daily at home. Restarted on Cardizem 60mg  Q6H to monitor his BP response before restarting Cardizem CD. Plan to restart Cardizem CD 240mg  on 12/23/2015. PRN IV Metoprolol for HR > 130.  3. ESRD - reports a history of CKD. Unfortunately no previous lab results are available for review. - Creatinine elevated to 7.55 on admission. Improved to 5.40 on 12/22/2015. - Nephrology following.  4. Hypoglycemia - per admitting team  5. Hyperkalemia - potassium elevated to 5.6 on 12/21/2015. Improved to 5.2 today. - started on Bicarb drip and given Kayexalate earlier today. - per admitting team  Signed, Erma Heritage , PA-C 8:00 AM 12/22/2015 Pager: [phone]

## 2015-12-22 NOTE — Progress Notes (Signed)
Patient given 82ml of D50 due to hypoglycemia of 68 per hypoglycemia protocol.

## 2015-12-23 LAB — BASIC METABOLIC PANEL
ANION GAP: 13 (ref 5–15)
BUN: 114 mg/dL — ABNORMAL HIGH (ref 6–20)
CO2: 19 mmol/L — ABNORMAL LOW (ref 22–32)
Calcium: 8.1 mg/dL — ABNORMAL LOW (ref 8.9–10.3)
Chloride: 98 mmol/L — ABNORMAL LOW (ref 101–111)
Creatinine, Ser: 4.73 mg/dL — ABNORMAL HIGH (ref 0.61–1.24)
GFR calc Af Amer: 12 mL/min — ABNORMAL LOW (ref >60)
GFR, EST NON AFRICAN AMERICAN: 10 mL/min — AB (ref >60)
Glucose, Bld: 326 mg/dL — ABNORMAL HIGH (ref 65–99)
POTASSIUM: 4.7 mmol/L (ref 3.5–5.1)
SODIUM: 130 mmol/L — AB (ref 135–145)

## 2015-12-23 LAB — CBC
HCT: 33.8 % — ABNORMAL LOW (ref 40.0–52.0)
Hemoglobin: 11.5 g/dL — ABNORMAL LOW (ref 13.0–18.0)
MCH: 31.1 pg (ref 26.0–34.0)
MCHC: 34 g/dL (ref 32.0–36.0)
MCV: 91.5 fL (ref 80.0–100.0)
PLATELETS: 128 10*3/uL — AB (ref 150–440)
RBC: 3.69 MIL/uL — AB (ref 4.40–5.90)
RDW: 14.5 % (ref 11.5–14.5)
WBC: 9.9 10*3/uL (ref 3.8–10.6)

## 2015-12-23 LAB — GLUCOSE, CAPILLARY
GLUCOSE-CAPILLARY: 260 mg/dL — AB (ref 65–99)
GLUCOSE-CAPILLARY: 356 mg/dL — AB (ref 65–99)
GLUCOSE-CAPILLARY: 324 mg/dL — AB (ref 65–99)
GLUCOSE-CAPILLARY: 260 mg/dL — AB (ref 65–99)
Glucose-Capillary: 283 mg/dL — ABNORMAL HIGH (ref 65–99)
Glucose-Capillary: 376 mg/dL — ABNORMAL HIGH (ref 65–99)
Glucose-Capillary: 237 mg/dL — ABNORMAL HIGH (ref 65–99)

## 2015-12-23 MED ORDER — PANTOPRAZOLE SODIUM 40 MG PO TBEC
40.0000 mg | DELAYED_RELEASE_TABLET | Freq: Two times a day (BID) | ORAL | Status: DC
Start: 2015-12-23 — End: 2015-12-26
  Administered 2015-12-23 – 2015-12-25 (×5): 40 mg via ORAL
  Filled 2015-12-23 (×5): qty 1

## 2015-12-23 MED ORDER — PREDNISONE 20 MG PO TABS
50.0000 mg | ORAL_TABLET | Freq: Every day | ORAL | Status: DC
  Administered 2015-12-23 – 2015-12-25 (×3): 50 mg via ORAL
  Filled 2015-12-23 (×3): qty 1

## 2015-12-23 MED ORDER — INSULIN ASPART 100 UNIT/ML ~~LOC~~ SOLN
0.0000 [IU] | Freq: Every day | SUBCUTANEOUS | Status: DC
  Administered 2015-12-23: 2 [IU] via SUBCUTANEOUS
  Filled 2015-12-23: qty 2
  Filled 2015-12-23: qty 7

## 2015-12-23 MED ORDER — INSULIN ASPART 100 UNIT/ML ~~LOC~~ SOLN
0.0000 [IU] | Freq: Three times a day (TID) | SUBCUTANEOUS | Status: DC
  Administered 2015-12-23: 9 [IU] via SUBCUTANEOUS
  Administered 2015-12-23: 5 [IU] via SUBCUTANEOUS
  Administered 2015-12-24: 7 [IU] via SUBCUTANEOUS
  Filled 2015-12-23 (×2): qty 5

## 2015-12-23 MED ORDER — DILTIAZEM HCL 60 MG PO TABS
60.0000 mg | ORAL_TABLET | Freq: Four times a day (QID) | ORAL | Status: DC
  Administered 2015-12-23 – 2015-12-26 (×12): 60 mg via ORAL
  Filled 2015-12-23 (×12): qty 1

## 2015-12-23 MED ORDER — INSULIN GLARGINE 100 UNIT/ML ~~LOC~~ SOLN
10.0000 [IU] | Freq: Every day | SUBCUTANEOUS | Status: DC
  Administered 2015-12-23: 10 [IU] via SUBCUTANEOUS
  Filled 2015-12-23 (×2): qty 0.1

## 2015-12-23 NOTE — Progress Notes (Signed)
Pt txing to 235 on cardiac monitoring per MD order, pt VSS, pt verbalized understanding of tx pt son updated on tx, report called to receiving RN, all questions answered, pt c/o L elbow pain, pt son feels "maybe having gout", MD notified

## 2015-12-23 NOTE — Progress Notes (Signed)
Pt with L hearing aide in place, R hearing aide is with daughter, pt glasses in pt's bag with clothing

## 2015-12-23 NOTE — Care Management (Signed)
Moved out of icu to 2A.  Patient seems confused...  answer to most questions is "I do not know"  except when CM asked if Cm could call his wife and patient stated "No."  Says he lives with his wife.He does not know the day.  Says he does not have diabetes.   Did repeat back the correct phone number.  PT consult is pending.  Unsure of patient's baseline.  Spoke with patient's son Flavio Millender.  Patient's wife  (also has dementia)  is currently a patient at [facility] for rehab for leg fracture. The day after wife went to rehab, patient admitted.  He is usually mentally clear but family has notices some increasing forgetfulness.  Lanny Hurst says that patient does have diabetes.  Under concurrent mental state, patient would not be able to live alone. He is usually able to ambulate independently.  Discussed this could clear.  Provided attending with [facility] phone number as he stated "no one has talked to me>"  Dr Tressia Miners says that she has spoken with patient's daughter and husband

## 2015-12-23 NOTE — Progress Notes (Signed)
Key Points: Use following P&T approved IV to PO antibiotic change policy.  Description contains the criteria that are approved Note: Policy Excludes:  Esophagectomy patientsPHARMACIST - PHYSICIAN COMMUNICATION CONCERNING: IV to Oral Route Change Policy  RECOMMENDATION: This patient is receiving pantoprazole by the intravenous route.  Based on criteria approved by the Pharmacy and Therapeutics Committee, the intravenous medication(s) is/are being converted to the equivalent oral dose form(s).   DESCRIPTION: These criteria include:  The patient is eating (either orally or via tube) and/or has been taking other orally administered medications for a least 24 hours  The patient has no evidence of active gastrointestinal bleeding or impaired GI absorption (gastrectomy, short bowel, patient on TNA or NPO).  If you have questions about this conversion, please contact the Pharmacy Department  []   [phone] )  [facility] [x]   [phone] )  [facility] []   [phone] )  [facility] []   [phone] )  [facility] []   [phone] )  [place], [facility] 12/23/2015 12:30 PM

## 2015-12-23 NOTE — Progress Notes (Signed)
[facility]  ROUNDING NOTE   Subjective:  Creatinine down to 4.7. However BUN slightly up to 114 Hyperkalemia resolved with potassium of 4.7. Patient seems to be a bit more awake and alert today.  Objective:  Vital signs in last 24 hours:  Temp:  [97.2 F (36.2 C)-99 F (37.2 C)] 97.2 F (36.2 C) (02/10 0340) Pulse Rate:  [87-111] 100 (02/10 0800) Resp:  [15-28] 18 (02/10 0800) BP: (105-131)/(46-96) 105/46 mmHg (02/10 0800) SpO2:  [93 %-100 %] 93 % (02/10 0800)  Weight change:  Filed Weights   01/05/2016 1841 12/16/2015 2300  Weight: 108.863 kg (240 lb) 107.548 kg (237 lb 1.6 oz)    Intake/Output: I/O last 3 completed shifts: In: 7261.9 [I.V.:7261.9] Out: 2520 [Urine:2520]   Intake/Output this shift:  Total I/O In: 560 [P.O.:560] Out: 200 [Urine:200]  Physical Exam: General: NAD, disheveled  Head: Normocephalic, atraumatic. Moist oral mucosal membranes  Eyes: Anicteric  Neck: Supple, trachea midline  Lungs:  Clear to auscultation normal effort  Heart: irregular  Abdomen:  Soft, nontender, BS present  Extremities:  trace peripheral edema.  Neurologic: Nonfocal, moving all four extremities, oriented to self/place  Skin: No lesions       Basic Metabolic Panel:  Recent Labs Lab 12/24/2015 1847 12/21/15 0228 12/21/15 1015 12/21/15 1016 12/21/15 1431 12/22/15 0643 12/23/15 0543  NA 136 136  --   --   --  134* 130*  K 5.0 5.3*  --  5.6* 5.5* 5.2* 4.7  CL 102 106  --   --   --  100* 98*  CO2 18* 18*  --   --   --  23 19*  GLUCOSE 81 69  --   --   --  86 326*  BUN 143* 132*  --   --   --  100* 114*  CREATININE 7.55* 7.20*  --   --   --  5.40* 4.73*  CALCIUM 8.9 8.3*  --   --   --  8.3* 8.1*  MG  --   --  2.0  --   --   --   --     Liver Function Tests: No results for input(s): AST, ALT, ALKPHOS, BILITOT, PROT, ALBUMIN in the last 168 hours. No results for input(s): LIPASE, AMYLASE in the last 168 hours. No results for input(s): AMMONIA in the  last 168 hours.  CBC:  Recent Labs Lab 12/16/2015 1847 12/21/15 0228 12/22/15 0643 12/23/15 0543  WBC 10.3 9.4 12.7* 9.9  NEUTROABS 8.5*  --   --   --   HGB 12.6* 11.5* 12.2* 11.5*  HCT 37.7* 34.7* 36.3* 33.8*  MCV 92.0 92.1 91.7 91.5  PLT 156 146* 151 128*    Cardiac Enzymes:  Recent Labs Lab 12/14/2015 1847 12/21/15 0228 12/21/15 0835 12/21/15 1431  TROPONINI 0.21* 0.25* 0.23* 0.16*    BNP: Invalid input(s): POCBNP  CBG:  Recent Labs Lab 12/22/15 2204 12/23/15 0231 12/23/15 0404 12/23/15 0629 12/23/15 0807  GLUCAP 177* 260* 283* 356* 324*    Microbiology: Results for orders placed or performed during the hospital encounter of 12/24/2015  MRSA PCR Screening     Status: None   Collection Time: 12/21/15  5:51 PM  Result Value Ref Range Status   MRSA by PCR NEGATIVE NEGATIVE Final    Comment:        The GeneXpert MRSA Assay (FDA approved for NASAL specimens only), is one component of a comprehensive MRSA colonization surveillance program. It is  not intended to diagnose MRSA infection nor to guide or monitor treatment for MRSA infections.     Coagulation Studies: No results for input(s): LABPROT, INR in the last 72 hours.  Urinalysis:  Recent Labs  12/29/2015 1847  COLORURINE STRAW*  LABSPEC 1.011  PHURINE 5.0  GLUCOSEU NEGATIVE  HGBUR NEGATIVE  BILIRUBINUR NEGATIVE  KETONESUR NEGATIVE  PROTEINUR NEGATIVE  NITRITE NEGATIVE  LEUKOCYTESUR NEGATIVE      Imaging: Ct Abdomen Pelvis Wo Contrast  12/22/2015  CLINICAL DATA:  Foul-smelling vomitus. Diarrhea and poor appetite. History of chronic renal disease. EXAM: CT ABDOMEN AND PELVIS WITHOUT CONTRAST TECHNIQUE: Multidetector CT imaging of the abdomen and pelvis was performed following the standard protocol without IV contrast. COMPARISON:  None. FINDINGS: Lower chest: Nonspecific peribronchovascular and subpleural interstitial thickening and reticulation, incompletely evaluated due to motion  artifact. There is calcified atherosclerotic disease of the coronary arteries. Aortic valve annulus calcifications are also seen. Hepatobiliary: No mass visualized on this un-enhanced exam. Sub cm right hepatic dome cyst. Status post prior cholecystectomy. Pancreas: No mass or inflammatory process identified on this un-enhanced exam. Spleen: Within normal limits in size. Adrenals/Urinary Tract: There is left renal atrophy and bilateral perirenal fat stranding. Stomach/Bowel: No evidence of obstruction, inflammatory process, or abnormal fluid collections. There are 2 probable duodenal diverticula (image 42 sequence 2) cysts. Vascular/Lymphatic: No pathologically enlarged lymph nodes. No evidence of abdominal aortic aneurysm. Reproductive: No mass or other significant abnormality. Prostatic gland calcifications are noted, nonspecific finding. Other: Mild nonspecific mesenteric stranding. Musculoskeletal: No suspicious bone lesions identified. There are multilevel osteoarthritic changes of the thoracic and lumbosacral spine. Likely degenerative compression deformity with approximately 40% height loss of T12 vertebral body is seen. Milder inferior endplate compression deformity of L2 vertebral body is also noted. IMPRESSION: No evidence of small-bowel obstruction or significant inflammatory changes. Non specific mesenteric stranding. Left renal atrophy and bilateral perinephric stranding, consistent with the given history of chronic renal failure. Otherwise, no evidence of significant abnormalities of the solid abdominal organs, accounting for lack of IV contrast. Focal duodenal prominence likely represents 2 duodenal diverticula. Poorly evaluated peribronchovascular and subpleural interstitial thickening and reticulation in the lower lobes of the lungs. This may represent an element of chronic interstitial lung disease. Electronically Signed   By: Fidela Salisbury M.D.   On: 12/22/2015 14:58   Dg Elbow 2 Views  Left  12/22/2015  CLINICAL DATA:  Left elbow pain, difficult to straighten. EXAM: LEFT ELBOW - 2 VIEW COMPARISON:  None. FINDINGS: An IV and associated Hep-Lock tubing project over the left elbow. There is a small elbow joint effusion, with the posterior fat pad minimally visible. Soft tissue swelling overlies the olecranon. There is mild spurring at the attachment site of the distal triceps. The supinator fat pad is indistinct and potentially effaced. There are some linear lucencies projecting along the radial head which are probably due to striations in the overlying soft tissue rather than the radial head fracture, although the supinator fat pad effacement can be associated with radial head fracture. No other significant bony findings. IMPRESSION: 1. Small elbow joint effusion with minimal visibility of the posterior fat pad along the bony margin. 2. Effaced supinator fat pad. This might be due to the underlying generalized edema in the subcutaneous tissues and within the region, but this can sometimes be associated with occult radial head fracture. 3. Soft tissue swelling overlying the olecranon, although this may be part of the generalized subcutaneous edema rather than necessarily indicating olecranon bursitis.  Electronically Signed   By: Van Clines M.D.   On: 12/22/2015 12:13   US Renal  12/22/2015  CLINICAL DATA:  Acute renal failure of unknown etiology. EXAM: RENAL / URINARY TRACT ULTRASOUND COMPLETE COMPARISON:  Abdominal and pelvic noncontrast CT scan of December 22, 2015 FINDINGS: Right Kidney: Length: 12.9 cm. The renal cortical echotexture remains lower than that of the adjacent liver. There is no discrete mass. There is no hydronephrosis. Little vascular flow is observed with Doppler. Left Kidney: Length: 9.3 cm. There is cortical atrophy. The cortical echotexture is greater than that on the right. There is no hydronephrosis. Limited vascular flow is observed with Doppler. Bladder: Appears  normal for degree of bladder distention. The prevoid volume is 406 cc. The postvoid volume is 127 cc. IMPRESSION: Parenchymal changes and atrophy consistent with medical renal disease on the left. The renal size and echotexture on the right appears normal. However, vascular flow appears diminished bilaterally. Moderate-sized postvoid residual urinary bladder volume. Electronically Signed   By: David  Martinique M.D.   On: 12/22/2015 15:04     Medications:   .  sodium bicarbonate  infusion 1000 mL 40 mL/hr at 12/23/15 0700   . aspirin EC  81 mg Oral Daily  . atorvastatin  40 mg Oral q1800  . diltiazem  10 mg Intravenous Q6H  . heparin  5,000 Units Subcutaneous 3 times per day  . insulin aspart  0-5 Units Subcutaneous QHS  . insulin aspart  0-9 Units Subcutaneous TID WC  . ondansetron (ZOFRAN) IV  4 mg Intravenous 4 times per day  . pantoprazole (PROTONIX) IV  40 mg Intravenous Q12H  . sodium chloride flush  3 mL Intravenous Q12H   acetaminophen **OR** acetaminophen, dextrose, metoprolol, ondansetron **OR** ondansetron (ZOFRAN) IV  Assessment/ Plan:  81 y.o. male with a PMHx of chronic atrial fibrillation, diabetes mellitus type 2, gout, hypertension, chronic kidney disease stage IV followed by [facility], who was admitted to [facility] on 12/18/2015 for evaluation of mental status in the setting of hypoglycemia.  1. Acute renal failure/chronic kidney disease stage IV baseline EGFR 19-22 followed by Dr. Posey Pronto at [facility]. Acute renal failure likely related to poor by mouth intake that was prolonged at home.  Atrophic left kidney on [place]. Negative spep, abnormal UPEP.  -BUN higher today but creatinine down to 4.7.  Urine output 1.6 L over the past 24 hours.  Continue IV fluid hydration withsodium bicarbonate drip for now.  No urgent indication for dialysis at the moment however if azotemiaworsens we may need to consider this.  2. Hyperkalemia. otassium down to 4.7.  Continue to  monitor.  3. Metabolic acidosis. serum bicarbonate down to 19 today.  Continue sodium bicarbonate drip at 40 cc per hour and continue to monitor serum bicarbonate levels..  4. Anemia chronic kidney disease. hemoglobin down to 11.5.  May be in part dilutional.  Continue to monitor hemoglobin.  5.  Abnormal UPEP/MGUS:  Consult hematology for this issue.    LOS: 3 Miguel Guerrero 2/10/201711:32 AM

## 2015-12-23 NOTE — Progress Notes (Signed)
[place] [facility] [place] NAME: Miguel Guerrero    MR#:  [id number]  DATE OF BIRTH:  1933-02-26  SUBJECTIVE:  CHIEF COMPLAINT:   Chief Complaint  Patient presents with  . Hypoglycemia   - Renal function is improving. Baseline GFR is around 19. -Patient is more alert. Denies any nausea or vomiting. Still has left elbow pain but improved since yesterday.  REVIEW OF SYSTEMS:  Review of Systems  Constitutional: Negative for fever and chills.  HENT: Negative for ear discharge and ear pain.   Eyes: Negative for blurred vision.  Respiratory: Negative for cough, shortness of breath and wheezing.   Cardiovascular: Negative for chest pain and palpitations.  Gastrointestinal: Negative for nausea, vomiting, abdominal pain, diarrhea and constipation.  Genitourinary: Negative for dysuria.  Musculoskeletal: Positive for myalgias and joint pain.       Left elbow pain and swelling  Neurological: Positive for weakness. Negative for dizziness, speech change, focal weakness, seizures and headaches.  Psychiatric/Behavioral: Negative for depression.    DRUG ALLERGIES:  No Known Allergies  VITALS:  Blood pressure 128/56, pulse 109, temperature 98 F (36.7 C), temperature source Oral, resp. rate 17, height 6\' 1"  (1.854 m), weight 107.548 kg (237 lb 1.6 oz), SpO2 98 %.  PHYSICAL EXAMINATION:  Physical Exam  GENERAL:  82 y.o.-year-old patient lying in the bed with no acute distress.  EYES: Pupils equal, round, reactive to light and accommodation. No scleral icterus. Extraocular muscles intact.  HEENT: Head atraumatic, normocephalic. Oropharynx and nasopharynx clear.  NECK:  Supple, no jugular venous distention. No thyroid enlargement, no tenderness.  LUNGS: Normal breath sounds bilaterally, no wheezing, rales,rhonchi or crepitation. No use of accessory muscles of respiration. Decreased bibasilar breath sounds CARDIOVASCULAR: S1, S2 normal. No rubs, or  gallops. 3/6 systolic murmur present ABDOMEN: Soft, nontender, nondistended. Bowel sounds present. No organomegaly or mass.  EXTREMITIES: No cyanosis, or clubbing. 1+ extremity edema NEUROLOGIC: Cranial nerves II through XII are intact. Left elbow pain and swelling. Sensation intact. Gait not checked.  PSYCHIATRIC: The patient is alert and oriented x 2-3.  SKIN: No obvious rash, lesion, or ulcer.    LABORATORY PANEL:   CBC  Recent Labs Lab 12/23/15 0543  WBC 9.9  HGB 11.5*  HCT 33.8*  PLT 128*   ------------------------------------------------------------------------------------------------------------------  Chemistries   Recent Labs Lab 12/21/15 1015  12/23/15 0543  NA  --   < > 130*  K  --   < > 4.7  CL  --   < > 98*  CO2  --   < > 19*  GLUCOSE  --   < > 326*  BUN  --   < > 114*  CREATININE  --   < > 4.73*  CALCIUM  --   < > 8.1*  MG 2.0  --   --   < > = values in this interval not displayed. ------------------------------------------------------------------------------------------------------------------  Cardiac Enzymes  Recent Labs Lab 12/21/15 1431  TROPONINI 0.16*   ------------------------------------------------------------------------------------------------------------------  RADIOLOGY:  Ct Abdomen Pelvis Wo Contrast  12/22/2015  CLINICAL DATA:  Foul-smelling vomitus. Diarrhea and poor appetite. History of chronic renal disease. EXAM: CT ABDOMEN AND PELVIS WITHOUT CONTRAST TECHNIQUE: Multidetector CT imaging of the abdomen and pelvis was performed following the standard protocol without IV contrast. COMPARISON:  None. FINDINGS: Lower chest: Nonspecific peribronchovascular and subpleural interstitial thickening and reticulation, incompletely evaluated due to motion artifact. There is calcified atherosclerotic disease of the coronary arteries. Aortic valve annulus  calcifications are also seen. Hepatobiliary: No mass visualized on this un-enhanced exam. Sub  cm right hepatic dome cyst. Status post prior cholecystectomy. Pancreas: No mass or inflammatory process identified on this un-enhanced exam. Spleen: Within normal limits in size. Adrenals/Urinary Tract: There is left renal atrophy and bilateral perirenal fat stranding. Stomach/Bowel: No evidence of obstruction, inflammatory process, or abnormal fluid collections. There are 2 probable duodenal diverticula (image 42 sequence 2) cysts. Vascular/Lymphatic: No pathologically enlarged lymph nodes. No evidence of abdominal aortic aneurysm. Reproductive: No mass or other significant abnormality. Prostatic gland calcifications are noted, nonspecific finding. Other: Mild nonspecific mesenteric stranding. Musculoskeletal: No suspicious bone lesions identified. There are multilevel osteoarthritic changes of the thoracic and lumbosacral spine. Likely degenerative compression deformity with approximately 40% height loss of T12 vertebral body is seen. Milder inferior endplate compression deformity of L2 vertebral body is also noted. IMPRESSION: No evidence of small-bowel obstruction or significant inflammatory changes. Non specific mesenteric stranding. Left renal atrophy and bilateral perinephric stranding, consistent with the given history of chronic renal failure. Otherwise, no evidence of significant abnormalities of the solid abdominal organs, accounting for lack of IV contrast. Focal duodenal prominence likely represents 2 duodenal diverticula. Poorly evaluated peribronchovascular and subpleural interstitial thickening and reticulation in the lower lobes of the lungs. This may represent an element of chronic interstitial lung disease. Electronically Signed   By: Fidela Salisbury M.D.   On: 12/22/2015 14:58   Dg Elbow 2 Views Left  12/22/2015  CLINICAL DATA:  Left elbow pain, difficult to straighten. EXAM: LEFT ELBOW - 2 VIEW COMPARISON:  None. FINDINGS: An IV and associated Hep-Lock tubing project over the left elbow.  There is a small elbow joint effusion, with the posterior fat pad minimally visible. Soft tissue swelling overlies the olecranon. There is mild spurring at the attachment site of the distal triceps. The supinator fat pad is indistinct and potentially effaced. There are some linear lucencies projecting along the radial head which are probably due to striations in the overlying soft tissue rather than the radial head fracture, although the supinator fat pad effacement can be associated with radial head fracture. No other significant bony findings. IMPRESSION: 1. Small elbow joint effusion with minimal visibility of the posterior fat pad along the bony margin. 2. Effaced supinator fat pad. This might be due to the underlying generalized edema in the subcutaneous tissues and within the region, but this can sometimes be associated with occult radial head fracture. 3. Soft tissue swelling overlying the olecranon, although this may be part of the generalized subcutaneous edema rather than necessarily indicating olecranon bursitis. Electronically Signed   By: Van Clines M.D.   On: 12/22/2015 12:13   US Renal  12/22/2015  CLINICAL DATA:  Acute renal failure of unknown etiology. EXAM: RENAL / URINARY TRACT ULTRASOUND COMPLETE COMPARISON:  Abdominal and pelvic noncontrast CT scan of December 22, 2015 FINDINGS: Right Kidney: Length: 12.9 cm. The renal cortical echotexture remains lower than that of the adjacent liver. There is no discrete mass. There is no hydronephrosis. Little vascular flow is observed with Doppler. Left Kidney: Length: 9.3 cm. There is cortical atrophy. The cortical echotexture is greater than that on the right. There is no hydronephrosis. Limited vascular flow is observed with Doppler. Bladder: Appears normal for degree of bladder distention. The prevoid volume is 406 cc. The postvoid volume is 127 cc. IMPRESSION: Parenchymal changes and atrophy consistent with medical renal disease on the left.  The renal size and echotexture on the right  appears normal. However, vascular flow appears diminished bilaterally. Moderate-sized postvoid residual urinary bladder volume. Electronically Signed   By: David  Martinique M.D.   On: 12/22/2015 15:04    EKG:   Orders placed or performed during the hospital encounter of 01/03/2016  . ED EKG  . ED EKG  . EKG 12-Lead  . EKG 12-Lead  . EKG 12-Lead  . EKG 12-Lead    ASSESSMENT AND PLAN:   82 y.o. male with a PMHx of chronic atrial fibrillation, diabetes mellitus type 2, gout, hypertension, chronic kidney disease stage IV followed by [facility], who was admitted to [facility] on 12/17/2015 for evaluation of mental status in the setting of hypoglycemia.  1. Hypoglycemia- Likely poor PO intake and also with ARF as patient was on insulin ad amaryl and actos at home - Sugars were elevated, so D10 drip discontinued, started low dose lantus and also SSi today  2. ARF on CKD stage 4- baseline gfr 20 per nephrology notes, now with fluids- improved to gfr 10 - cr improved to 4.73 - renal US with atrophic kidneys - no acute indication for dialysis now, but might be heading that way - appreciate nephrology consult - monitor BMP  3. Nausea/vomiting- CT abd with no obstruction ? Uremia with worsened renal function - Much improved today, on protonix BID for now -Advance to low sodium diet today  4. Hyperkalemia- Improving with improving renal function  5. Left elbow pain- X ray with effusion, may be olecranon bursitis or radial head occult fracture- not seen on X ray -Could be gout as well. Hold off on allopurinol due to renal failure - Oral prednisone once today - ortho consulted, keep it elevated and ice pack prn  6. Anemia of chronic disease- stable, monitor for now  7. Atrial fibrillation-started on oral Cardizem. - hold off anticoagulation with warfarin due to dark vomitus and also melena. Also possible falling at home. - appreciate cardiology  consult Monitor for now  ECHO with normal LV function, elevated troponin- likely from demand ischemia in the setting of hypoglycemia  8. DVT Prophylaxis- on SQ heparin   Physical therapy consult once more medically stable. Spoke with friend and also son-in-law.   All the records are reviewed and case discussed with Care Management/Social Workerr. Management plans discussed with the patient, family and they are in agreement.  CODE STATUS: Full Code  TOTAL CRITICAL CARE TIME SPENT IN TAKING CARE OF THIS PATIENT: 39 minutes.   POSSIBLE D/C IN 3-4 DAYS, DEPENDING ON CLINICAL CONDITION.   Bernie Ransford M.D on 12/23/2015 at 3:16 PM  Between 7am to 6pm - Pager - [phone]  After 6pm go to [email or web address] - password [email or web address] [facility]  [facility]  Office  [phone]  CC: Primary care physician; No primary care provider on file.

## 2015-12-23 NOTE — Progress Notes (Signed)
Inpatient Diabetes Program Recommendations  AACE/ADA: New Consensus Statement on Inpatient Glycemic Control (2015)  Target Ranges:  Prepandial:   less than 140 mg/dL      Peak postprandial:   less than 180 mg/dL (1-2 hours)      Critically ill patients:  140 - 180 mg/dL   Review of Glycemic Control  Results for Miguel Guerrero, Miguel Guerrero (MRN [id number]) as of 12/23/2015 07:19  Ref. Range 12/22/2015 20:16 12/22/2015 22:04 12/23/2015 02:31 12/23/2015 04:04 12/23/2015 06:29  Glucose-Capillary Latest Ref Range: 65-99 mg/dL 166 (H) 177 (H) 260 (H) 283 (H) 356 (H)     Diabetes history: Type 2 Outpatient Diabetes medications: Amaryl 6mg  qday, Lantus 50 units qday, Actos 45 mg qday Current orders for Inpatient glycemic control: D10 IV @ 83ml/hr  Inpatient Diabetes Program Recommendations:  Patient may now require low dose insulin. Consider 11 units of Lantus qday starting now (0.1units/kg) and Novolog correction insulin 0-15 units q6h  Do not re-order Glypizide on discharge.  Gentry Fitz, RN, BA, MHA, CDE Diabetes Coordinator Inpatient Diabetes Program  [phone] (Team Pager) [phone] ([place]) 12/23/2015 7:26 AM

## 2015-12-23 NOTE — Progress Notes (Signed)
eLink Physician-Brief Progress Note Patient Name: Miguel Guerrero DOB: 29-Jun-1933 MRN: [id number]   Date of Service  12/23/2015  HPI/Events of Note  Hypoglycemia now resolved. Hyperglycemic on D10 infusion.  eICU Interventions  Decreased D10 infusion to 50cc/hr.     Intervention Category Intermediate Interventions: Hyperglycemia - evaluation and treatment  Tera Partridge 12/23/2015, 3:10 AM

## 2015-12-23 NOTE — Consult Note (Signed)
ORTHOPAEDIC CONSULTATION  REQUESTING PHYSICIAN: Gladstone Lighter, MD  Chief Complaint:   Posterior left elbow pain.  History of Present Illness: Miguel Guerrero is a 82 y.o. male with multiple medical problems, including diabetes, hypertension, gout, hyperlipidemia, chronic atrial fibrillation, and end-stage renal disease, who was admitted several days ago for poorly controlled diabetes and an elevated creatinine level. During this admission, the patient began to complain of some posterior left elbow pain, prompting this orthopedic referral. The patient denies any injury to the elbow, and denies falling on it. He denies any numbness or paresthesias involving the left upper extremity.  Past Medical History  Diagnosis Date  . Chronic atrial fibrillation (HCC)     a. CHA2DS2-VASc Score is 4 (HTN, DM, Age (2)) On Coumadin in the past, not currently.  . Type 2 diabetes mellitus ([place])   . Gout   . HTN (hypertension)   . HLD (hyperlipidemia)   . ESRD (end stage renal disease) [facility])    Past Surgical History  Procedure Laterality Date  . Cholecystectomy     Social History   Social History  . Marital Status: Married    Spouse Name: N/A  . Number of Children: N/A  . Years of Education: N/A   Social History Main Topics  . Smoking status: Former Smoker    Quit date: 11/12/1968  . Smokeless tobacco: Never Used  . Alcohol Use: No  . Drug Use: No  . Sexual Activity: Not Currently   Other Topics Concern  . None   Social History Narrative   Family History  Problem Relation Age of Onset  . Breast cancer Mother    No Known Allergies Prior to Admission medications   Medication Sig Start Date End Date Taking? Authorizing Provider  allopurinol (ZYLOPRIM) 300 MG tablet Take 300 mg by mouth daily.   Yes Historical Provider, MD  aspirin EC 81 MG tablet Take 81 mg by mouth daily.   Yes Historical Provider, MD  digoxin (LANOXIN)  0.125 MG tablet Take 0.125 mg by mouth daily.   Yes Historical Provider, MD  furosemide (LASIX) 20 MG tablet Take 20 mg by mouth daily.   Yes Historical Provider, MD  glimepiride (AMARYL) 4 MG tablet Take 6 mg by mouth daily with breakfast.   Yes Historical Provider, MD  insulin glargine (LANTUS) 100 UNIT/ML injection Inject 50 Units into the skin daily.    Yes Historical Provider, MD  pioglitazone (ACTOS) 45 MG tablet Take 45 mg by mouth daily.   Yes Historical Provider, MD  simvastatin (ZOCOR) 40 MG tablet Take 40 mg by mouth daily.    Yes Historical Provider, MD  torsemide (DEMADEX) 20 MG tablet Take 20 mg by mouth daily.   Yes Historical Provider, MD  Vitamin D, Ergocalciferol, (DRISDOL) 50000 units CAPS capsule Take 50,000 Units by mouth every 7 (seven) days. Pt takes on Monday.   Yes Historical Provider, MD   Ct Abdomen Pelvis Wo Contrast  12/22/2015  CLINICAL DATA:  Foul-smelling vomitus. Diarrhea and poor appetite. History of chronic renal disease. EXAM: CT ABDOMEN AND PELVIS WITHOUT CONTRAST TECHNIQUE: Multidetector CT imaging of the abdomen and pelvis was performed following the standard protocol without IV contrast. COMPARISON:  None. FINDINGS: Lower chest: Nonspecific peribronchovascular and subpleural interstitial thickening and reticulation, incompletely evaluated due to motion artifact. There is calcified atherosclerotic disease of the coronary arteries. Aortic valve annulus calcifications are also seen. Hepatobiliary: No mass visualized on this un-enhanced exam. Sub cm right hepatic dome cyst. Status post prior cholecystectomy.  Pancreas: No mass or inflammatory process identified on this un-enhanced exam. Spleen: Within normal limits in size. Adrenals/Urinary Tract: There is left renal atrophy and bilateral perirenal fat stranding. Stomach/Bowel: No evidence of obstruction, inflammatory process, or abnormal fluid collections. There are 2 probable duodenal diverticula (image 42 sequence 2)  cysts. Vascular/Lymphatic: No pathologically enlarged lymph nodes. No evidence of abdominal aortic aneurysm. Reproductive: No mass or other significant abnormality. Prostatic gland calcifications are noted, nonspecific finding. Other: Mild nonspecific mesenteric stranding. Musculoskeletal: No suspicious bone lesions identified. There are multilevel osteoarthritic changes of the thoracic and lumbosacral spine. Likely degenerative compression deformity with approximately 40% height loss of T12 vertebral body is seen. Milder inferior endplate compression deformity of L2 vertebral body is also noted. IMPRESSION: No evidence of small-bowel obstruction or significant inflammatory changes. Non specific mesenteric stranding. Left renal atrophy and bilateral perinephric stranding, consistent with the given history of chronic renal failure. Otherwise, no evidence of significant abnormalities of the solid abdominal organs, accounting for lack of IV contrast. Focal duodenal prominence likely represents 2 duodenal diverticula. Poorly evaluated peribronchovascular and subpleural interstitial thickening and reticulation in the lower lobes of the lungs. This may represent an element of chronic interstitial lung disease. Electronically Signed   By: Fidela Salisbury M.D.   On: 12/22/2015 14:58   Dg Elbow 2 Views Left  12/22/2015  CLINICAL DATA:  Left elbow pain, difficult to straighten. EXAM: LEFT ELBOW - 2 VIEW COMPARISON:  None. FINDINGS: An IV and associated Hep-Lock tubing project over the left elbow. There is a small elbow joint effusion, with the posterior fat pad minimally visible. Soft tissue swelling overlies the olecranon. There is mild spurring at the attachment site of the distal triceps. The supinator fat pad is indistinct and potentially effaced. There are some linear lucencies projecting along the radial head which are probably due to striations in the overlying soft tissue rather than the radial head fracture,  although the supinator fat pad effacement can be associated with radial head fracture. No other significant bony findings. IMPRESSION: 1. Small elbow joint effusion with minimal visibility of the posterior fat pad along the bony margin. 2. Effaced supinator fat pad. This might be due to the underlying generalized edema in the subcutaneous tissues and within the region, but this can sometimes be associated with occult radial head fracture. 3. Soft tissue swelling overlying the olecranon, although this may be part of the generalized subcutaneous edema rather than necessarily indicating olecranon bursitis. Electronically Signed   By: Van Clines M.D.   On: 12/22/2015 12:13   US Renal  12/22/2015  CLINICAL DATA:  Acute renal failure of unknown etiology. EXAM: RENAL / URINARY TRACT ULTRASOUND COMPLETE COMPARISON:  Abdominal and pelvic noncontrast CT scan of December 22, 2015 FINDINGS: Right Kidney: Length: 12.9 cm. The renal cortical echotexture remains lower than that of the adjacent liver. There is no discrete mass. There is no hydronephrosis. Little vascular flow is observed with Doppler. Left Kidney: Length: 9.3 cm. There is cortical atrophy. The cortical echotexture is greater than that on the right. There is no hydronephrosis. Limited vascular flow is observed with Doppler. Bladder: Appears normal for degree of bladder distention. The prevoid volume is 406 cc. The postvoid volume is 127 cc. IMPRESSION: Parenchymal changes and atrophy consistent with medical renal disease on the left. The renal size and echotexture on the right appears normal. However, vascular flow appears diminished bilaterally. Moderate-sized postvoid residual urinary bladder volume. Electronically Signed   By: David  Martinique M.D.  On: 12/22/2015 15:04    Positive ROS: All other systems have been reviewed and were otherwise negative with the exception of those mentioned in the HPI and as above.  Physical Exam: General:  Alert, no  acute distress Psychiatric:  Patient is competent for consent with normal mood and affect   Cardiovascular:  No pedal edema Respiratory:  No wheezing, non-labored breathing GI:  Abdomen is soft and non-tender Skin:  No lesions in the area of chief complaint Neurologic:  Sensation intact distally Lymphatic:  No axillary or cervical lymphadenopathy  Orthopedic Exam:  Orthopedic examination is limited to the left elbow and upper extremity. Skin inspection around the elbow demonstrates mild swelling posteriorly, but there is no erythema, ecchymosis, lacerations, or abrasions. He has mild to moderate focal tenderness to palpation over the posterior elbow region, but he has no tenderness over the medial or lateral aspects of the elbow, especially over the radial head. There is no obvious elbow effusion clinically. He is able to actively flex and extend his elbow from 20-100 without pain or catching. He is neurovascularly intact to his left forearm and hand.  X-rays:  AP and lateral x-rays of the left elbow are available for review. These films demonstrate early degenerative changes, but no fractures or lytic lesions. There may be some mildly increased soft tissue shadowing in the posterior aspect of the elbow, as well as a possible small elbow effusion, according to the report.  Assessment: Posterior left elbow pain, possibly secondary to sterile olecranon bursitis.  Plan: The treatment options were discussed with the patient. The patient does not feel that the elbow is bothering him all that much, so he does not wish to pursue aggressive intervention at this time. Given his good range of motion, lack of elbow effusion, and no evidence for any septic process clinically, I do not feel that surgical intervention is warranted at this time. I have suggested that he wear an elbow pad to try to minimize pressure on this area, especially if he is trying to use his elbows to move himself around in bed. He may  receive over-the-counter medications as needed. Ice may be applied to the elbow as well if indicated clinically.  Thank you for asking me to participate in the care of this gentleman. I will check back on him in the next day or so.   Pascal Lux, MD  Beeper #:  [id number]  12/23/2015 5:37 PM

## 2015-12-24 DIAGNOSIS — E11649 Type 2 diabetes mellitus with hypoglycemia without coma: Secondary | ICD-10-CM

## 2015-12-24 DIAGNOSIS — I4891 Unspecified atrial fibrillation: Secondary | ICD-10-CM

## 2015-12-24 DIAGNOSIS — R4182 Altered mental status, unspecified: Secondary | ICD-10-CM

## 2015-12-24 DIAGNOSIS — Z7982 Long term (current) use of aspirin: Secondary | ICD-10-CM

## 2015-12-24 DIAGNOSIS — Z794 Long term (current) use of insulin: Secondary | ICD-10-CM

## 2015-12-24 DIAGNOSIS — Z79899 Other long term (current) drug therapy: Secondary | ICD-10-CM

## 2015-12-24 DIAGNOSIS — E785 Hyperlipidemia, unspecified: Secondary | ICD-10-CM

## 2015-12-24 DIAGNOSIS — I129 Hypertensive chronic kidney disease with stage 1 through stage 4 chronic kidney disease, or unspecified chronic kidney disease: Secondary | ICD-10-CM

## 2015-12-24 DIAGNOSIS — E1122 Type 2 diabetes mellitus with diabetic chronic kidney disease: Secondary | ICD-10-CM

## 2015-12-24 DIAGNOSIS — N184 Chronic kidney disease, stage 4 (severe): Secondary | ICD-10-CM

## 2015-12-24 LAB — GLUCOSE, CAPILLARY
GLUCOSE-CAPILLARY: 414 mg/dL — AB (ref 65–99)
GLUCOSE-CAPILLARY: 409 mg/dL — AB (ref 65–99)
Glucose-Capillary: 343 mg/dL — ABNORMAL HIGH (ref 65–99)
Glucose-Capillary: 331 mg/dL — ABNORMAL HIGH (ref 65–99)

## 2015-12-24 LAB — BASIC METABOLIC PANEL
Anion gap: 11 (ref 5–15)
BUN: 107 mg/dL — AB (ref 6–20)
CALCIUM: 8.5 mg/dL — AB (ref 8.9–10.3)
CO2: 23 mmol/L (ref 22–32)
CREATININE: 4.48 mg/dL — AB (ref 0.61–1.24)
Chloride: 98 mmol/L — ABNORMAL LOW (ref 101–111)
GFR calc Af Amer: 13 mL/min — ABNORMAL LOW (ref >60)
GFR, EST NON AFRICAN AMERICAN: 11 mL/min — AB (ref >60)
GLUCOSE: 346 mg/dL — AB (ref 65–99)
Potassium: 5 mmol/L (ref 3.5–5.1)
Sodium: 132 mmol/L — ABNORMAL LOW (ref 135–145)

## 2015-12-24 MED ORDER — ALLOPURINOL 100 MG PO TABS
150.0000 mg | ORAL_TABLET | Freq: Every day | ORAL | Status: DC
  Administered 2015-12-25: 150 mg via ORAL
  Filled 2015-12-24: qty 1

## 2015-12-24 MED ORDER — INSULIN ASPART 100 UNIT/ML ~~LOC~~ SOLN
0.0000 [IU] | Freq: Three times a day (TID) | SUBCUTANEOUS | Status: DC
  Administered 2015-12-25 (×2): 8 [IU] via SUBCUTANEOUS
  Administered 2015-12-25: 5 [IU] via SUBCUTANEOUS
  Administered 2015-12-26 (×2): 11 [IU] via SUBCUTANEOUS
  Filled 2015-12-24: qty 5
  Filled 2015-12-24: qty 8
  Filled 2015-12-24: qty 11
  Filled 2015-12-24: qty 5
  Filled 2015-12-24: qty 11
  Filled 2015-12-24: qty 8

## 2015-12-24 MED ORDER — INSULIN GLARGINE 100 UNIT/ML ~~LOC~~ SOLN
30.0000 [IU] | Freq: Every day | SUBCUTANEOUS | Status: DC
  Administered 2015-12-24: 30 [IU] via SUBCUTANEOUS
  Filled 2015-12-24 (×2): qty 0.3

## 2015-12-24 MED ORDER — INSULIN ASPART 100 UNIT/ML ~~LOC~~ SOLN
0.0000 [IU] | Freq: Every day | SUBCUTANEOUS | Status: DC
  Administered 2015-12-24: 4 [IU] via SUBCUTANEOUS
  Administered 2015-12-25: 3 [IU] via SUBCUTANEOUS
  Filled 2015-12-24: qty 4
  Filled 2015-12-24: qty 3

## 2015-12-24 MED ORDER — INSULIN GLARGINE 100 UNIT/ML ~~LOC~~ SOLN
10.0000 [IU] | Freq: Once | SUBCUTANEOUS | Status: AC
  Administered 2015-12-24: 10 [IU] via SUBCUTANEOUS
  Filled 2015-12-24: qty 0.1

## 2015-12-24 MED ORDER — INSULIN ASPART 100 UNIT/ML ~~LOC~~ SOLN
20.0000 [IU] | Freq: Once | SUBCUTANEOUS | Status: AC
  Administered 2015-12-24: 20 [IU] via SUBCUTANEOUS
  Filled 2015-12-24: qty 20

## 2015-12-24 NOTE — Progress Notes (Signed)
[facility]  ROUNDING NOTE   Subjective:  Creatinine trending down slowly. Currently 4.4 with a BUN of 107. Blood glucose noted to be high at 346. Over the preceding 24 hours urine output was 1.4 L. Objective:  Vital signs in last 24 hours:  Temp:  [98 F (36.7 C)-99 F (37.2 C)] 98.3 F (36.8 C) (02/11 0426) Pulse Rate:  [82-113] 82 (02/11 0426) Resp:  [17-20] 18 (02/11 0426) BP: (107-154)/(53-69) 118/58 mmHg (02/11 0426) SpO2:  [92 %-100 %] 92 % (02/11 0426)  Weight change:  Filed Weights   12/17/2015 1841 01/03/2016 2300  Weight: 108.863 kg (240 lb) 107.548 kg (237 lb 1.6 oz)    Intake/Output: I/O last 3 completed shifts: In: 2728.3 [P.O.:1040; I.V.:1688.3] Out: 2350 [Urine:2350]   Intake/Output this shift:  Total I/O In: -  Out: 525 [Urine:525]  Physical Exam: General: NAD, disheveled  Head: Normocephalic, atraumatic. Moist oral mucosal membranes  Eyes: Anicteric  Neck: Supple, trachea midline  Lungs:  Clear to auscultation normal effort  Heart: irregular  Abdomen:  Soft, nontender, BS present  Extremities:  trace peripheral edema.  Neurologic: Nonfocal, moving all four extremities, oriented to self/place  Skin: No lesions       Basic Metabolic Panel:  Recent Labs Lab 01/09/2016 1847 12/21/15 0228 12/21/15 1015 12/21/15 1016 12/21/15 1431 12/22/15 0643 12/23/15 0543 12/24/15 0428  NA 136 136  --   --   --  134* 130* 132*  K 5.0 5.3*  --  5.6* 5.5* 5.2* 4.7 5.0  CL 102 106  --   --   --  100* 98* 98*  CO2 18* 18*  --   --   --  23 19* 23  GLUCOSE 81 69  --   --   --  86 326* 346*  BUN 143* 132*  --   --   --  100* 114* 107*  CREATININE 7.55* 7.20*  --   --   --  5.40* 4.73* 4.48*  CALCIUM 8.9 8.3*  --   --   --  8.3* 8.1* 8.5*  MG  --   --  2.0  --   --   --   --   --     Liver Function Tests: No results for input(s): AST, ALT, ALKPHOS, BILITOT, PROT, ALBUMIN in the last 168 hours. No results for input(s): LIPASE, AMYLASE in the  last 168 hours. No results for input(s): AMMONIA in the last 168 hours.  CBC:  Recent Labs Lab 01/01/2016 1847 12/21/15 0228 12/22/15 0643 12/23/15 0543  WBC 10.3 9.4 12.7* 9.9  NEUTROABS 8.5*  --   --   --   HGB 12.6* 11.5* 12.2* 11.5*  HCT 37.7* 34.7* 36.3* 33.8*  MCV 92.0 92.1 91.7 91.5  PLT 156 146* 151 128*    Cardiac Enzymes:  Recent Labs Lab 01/03/2016 1847 12/21/15 0228 12/21/15 0835 12/21/15 1431  TROPONINI 0.21* 0.25* 0.23* 0.16*    BNP: Invalid input(s): POCBNP  CBG:  Recent Labs Lab 12/23/15 0807 12/23/15 1120 12/23/15 1624 12/23/15 2052 12/24/15 0727  GLUCAP 324* 376* 260* 237* 343*    Microbiology: Results for orders placed or performed during the hospital encounter of 01/08/2016  MRSA PCR Screening     Status: None   Collection Time: 12/21/15  5:51 PM  Result Value Ref Range Status   MRSA by PCR NEGATIVE NEGATIVE Final    Comment:        The GeneXpert MRSA Assay (FDA approved for  NASAL specimens only), is one component of a comprehensive MRSA colonization surveillance program. It is not intended to diagnose MRSA infection nor to guide or monitor treatment for MRSA infections.     Coagulation Studies: No results for input(s): LABPROT, INR in the last 72 hours.  Urinalysis: No results for input(s): COLORURINE, LABSPEC, PHURINE, GLUCOSEU, HGBUR, BILIRUBINUR, KETONESUR, PROTEINUR, UROBILINOGEN, NITRITE, LEUKOCYTESUR in the last 72 hours.  Invalid input(s): APPERANCEUR    Imaging: Ct Abdomen Pelvis Wo Contrast  12/22/2015  CLINICAL DATA:  Foul-smelling vomitus. Diarrhea and poor appetite. History of chronic renal disease. EXAM: CT ABDOMEN AND PELVIS WITHOUT CONTRAST TECHNIQUE: Multidetector CT imaging of the abdomen and pelvis was performed following the standard protocol without IV contrast. COMPARISON:  None. FINDINGS: Lower chest: Nonspecific peribronchovascular and subpleural interstitial thickening and reticulation, incompletely  evaluated due to motion artifact. There is calcified atherosclerotic disease of the coronary arteries. Aortic valve annulus calcifications are also seen. Hepatobiliary: No mass visualized on this un-enhanced exam. Sub cm right hepatic dome cyst. Status post prior cholecystectomy. Pancreas: No mass or inflammatory process identified on this un-enhanced exam. Spleen: Within normal limits in size. Adrenals/Urinary Tract: There is left renal atrophy and bilateral perirenal fat stranding. Stomach/Bowel: No evidence of obstruction, inflammatory process, or abnormal fluid collections. There are 2 probable duodenal diverticula (image 42 sequence 2) cysts. Vascular/Lymphatic: No pathologically enlarged lymph nodes. No evidence of abdominal aortic aneurysm. Reproductive: No mass or other significant abnormality. Prostatic gland calcifications are noted, nonspecific finding. Other: Mild nonspecific mesenteric stranding. Musculoskeletal: No suspicious bone lesions identified. There are multilevel osteoarthritic changes of the thoracic and lumbosacral spine. Likely degenerative compression deformity with approximately 40% height loss of T12 vertebral body is seen. Milder inferior endplate compression deformity of L2 vertebral body is also noted. IMPRESSION: No evidence of small-bowel obstruction or significant inflammatory changes. Non specific mesenteric stranding. Left renal atrophy and bilateral perinephric stranding, consistent with the given history of chronic renal failure. Otherwise, no evidence of significant abnormalities of the solid abdominal organs, accounting for lack of IV contrast. Focal duodenal prominence likely represents 2 duodenal diverticula. Poorly evaluated peribronchovascular and subpleural interstitial thickening and reticulation in the lower lobes of the lungs. This may represent an element of chronic interstitial lung disease. Electronically Signed   By: Fidela Salisbury M.D.   On: 12/22/2015 14:58    US Renal  12/22/2015  CLINICAL DATA:  Acute renal failure of unknown etiology. EXAM: RENAL / URINARY TRACT ULTRASOUND COMPLETE COMPARISON:  Abdominal and pelvic noncontrast CT scan of December 22, 2015 FINDINGS: Right Kidney: Length: 12.9 cm. The renal cortical echotexture remains lower than that of the adjacent liver. There is no discrete mass. There is no hydronephrosis. Little vascular flow is observed with Doppler. Left Kidney: Length: 9.3 cm. There is cortical atrophy. The cortical echotexture is greater than that on the right. There is no hydronephrosis. Limited vascular flow is observed with Doppler. Bladder: Appears normal for degree of bladder distention. The prevoid volume is 406 cc. The postvoid volume is 127 cc. IMPRESSION: Parenchymal changes and atrophy consistent with medical renal disease on the left. The renal size and echotexture on the right appears normal. However, vascular flow appears diminished bilaterally. Moderate-sized postvoid residual urinary bladder volume. Electronically Signed   By: David  Martinique M.D.   On: 12/22/2015 15:04     Medications:   .  sodium bicarbonate  infusion 1000 mL 40 mL/hr at 12/23/15 1711   . [START ON 12/25/2015] allopurinol  150 mg Oral  Daily  . aspirin EC  81 mg Oral Daily  . atorvastatin  40 mg Oral q1800  . diltiazem  60 mg Oral 4 times per day  . heparin  5,000 Units Subcutaneous 3 times per day  . insulin aspart  0-5 Units Subcutaneous QHS  . insulin aspart  0-9 Units Subcutaneous TID WC  . insulin glargine  30 Units Subcutaneous QHS  . pantoprazole  40 mg Oral BID AC  . predniSONE  50 mg Oral Daily  . sodium chloride flush  3 mL Intravenous Q12H   acetaminophen **OR** acetaminophen, dextrose, metoprolol, ondansetron **OR** ondansetron (ZOFRAN) IV  Assessment/ Plan:  82 y.o. male with a PMHx of chronic atrial fibrillation, diabetes mellitus type 2, gout, hypertension, chronic kidney disease stage IV followed by [facility], who was  admitted to [facility] on 12/28/2015 for evaluation of mental status in the setting of hypoglycemia.  1. Acute renal failure/chronic kidney disease stage IV baseline EGFR 19-22 followed by Dr. Posey Pronto at [facility]. Acute renal failure likely related to poor by mouth intake that was prolonged at home.  Atrophic left kidney on [place]. Negative spep, abnormal UPEP.  -BUN down to 107 and creatinine currently down to 4.4. Good urine output noted. Therefore as before no urgent indication to proceed with dialysis however patient may need in the future depending upon clinical circumstances. Continue to monitor renal function and urine output for now.  2. Hyperkalemia.Potassium slightly higher today at 5.0. Continue to monitor.  3. Metabolic acidosis. Improved. Serum bicarbonate up to 23 today. Continue sodium bicarbonate drip.  4. Anemia chronic kidney disease. Last hemoglobin was 11.5. No indication for Procrit at the moment.  5.  Abnormal UPEP/MGUS:  Awaiting hematology input.   LOS: 4 Manson [place] 2/11/201710:22 AM

## 2015-12-24 NOTE — Progress Notes (Signed)
[facility] [place] NAME: Miguel Guerrero    MR#:  [id number]  DATE OF BIRTH:  November 06, 1933  SUBJECTIVE:  CHIEF COMPLAINT:   Chief Complaint  Patient presents with  . Hypoglycemia   - Renal function slowly improving. Baseline GFR is around 19. Now at 5 -Patient is more alert. Some confusion persists, sugars are elevated now - complains of right knee pain  REVIEW OF SYSTEMS:  Review of Systems  Constitutional: Negative for fever and chills.  HENT: Negative for ear discharge and ear pain.   Eyes: Negative for blurred vision.  Respiratory: Negative for cough, shortness of breath and wheezing.   Cardiovascular: Negative for chest pain and palpitations.  Gastrointestinal: Negative for nausea, vomiting, abdominal pain, diarrhea and constipation.  Genitourinary: Negative for dysuria.  Musculoskeletal: Positive for myalgias and joint pain.       Left elbow pain and swelling Right knee pain  Neurological: Positive for weakness. Negative for dizziness, speech change, focal weakness, seizures and headaches.  Psychiatric/Behavioral: Negative for depression.    DRUG ALLERGIES:  No Known Allergies  VITALS:  Blood pressure 118/58, pulse 82, temperature 98.3 F (36.8 C), temperature source Oral, resp. rate 18, height 6\' 1"  (1.854 m), weight 107.548 kg (237 lb 1.6 oz), SpO2 92 %.  PHYSICAL EXAMINATION:  Physical Exam  GENERAL:  82 y.o.-year-old patient lying in the bed with no acute distress.  EYES: Pupils equal, round, reactive to light and accommodation. No scleral icterus. Extraocular muscles intact.  HEENT: Head atraumatic, normocephalic. Oropharynx and nasopharynx clear.  NECK:  Supple, no jugular venous distention. No thyroid enlargement, no tenderness.  LUNGS: Normal breath sounds bilaterally, no wheezing, rales,rhonchi or crepitation. No use of accessory muscles of respiration. Decreased bibasilar breath sounds CARDIOVASCULAR: S1,  S2 normal. No rubs, or gallops. 3/6 systolic murmur present ABDOMEN: Soft, nontender, nondistended. Bowel sounds present. No organomegaly or mass.  EXTREMITIES: No cyanosis, or clubbing. 1+ extremity edema NEUROLOGIC: Cranial nerves II through XII are intact. Left elbow pain and swelling. Sensation intact. Gait not checked. Right knee tenderness anteriorly, no significant swelling or erythema noted. PSYCHIATRIC: The patient is alert and oriented x 2-3.  SKIN: No obvious rash, lesion, or ulcer.    LABORATORY PANEL:   CBC  Recent Labs Lab 12/23/15 0543  WBC 9.9  HGB 11.5*  HCT 33.8*  PLT 128*   ------------------------------------------------------------------------------------------------------------------  Chemistries   Recent Labs Lab 12/21/15 1015  12/24/15 0428  NA  --   < > 132*  K  --   < > 5.0  CL  --   < > 98*  CO2  --   < > 23  GLUCOSE  --   < > 346*  BUN  --   < > 107*  CREATININE  --   < > 4.48*  CALCIUM  --   < > 8.5*  MG 2.0  --   --   < > = values in this interval not displayed. ------------------------------------------------------------------------------------------------------------------  Cardiac Enzymes  Recent Labs Lab 12/21/15 1431  TROPONINI 0.16*   ------------------------------------------------------------------------------------------------------------------  RADIOLOGY:  Ct Abdomen Pelvis Wo Contrast  12/22/2015  CLINICAL DATA:  Foul-smelling vomitus. Diarrhea and poor appetite. History of chronic renal disease. EXAM: CT ABDOMEN AND PELVIS WITHOUT CONTRAST TECHNIQUE: Multidetector CT imaging of the abdomen and pelvis was performed following the standard protocol without IV contrast. COMPARISON:  None. FINDINGS: Lower chest: Nonspecific peribronchovascular and subpleural interstitial thickening and reticulation, incompletely evaluated due to  motion artifact. There is calcified atherosclerotic disease of the coronary arteries. Aortic valve  annulus calcifications are also seen. Hepatobiliary: No mass visualized on this un-enhanced exam. Sub cm right hepatic dome cyst. Status post prior cholecystectomy. Pancreas: No mass or inflammatory process identified on this un-enhanced exam. Spleen: Within normal limits in size. Adrenals/Urinary Tract: There is left renal atrophy and bilateral perirenal fat stranding. Stomach/Bowel: No evidence of obstruction, inflammatory process, or abnormal fluid collections. There are 2 probable duodenal diverticula (image 42 sequence 2) cysts. Vascular/Lymphatic: No pathologically enlarged lymph nodes. No evidence of abdominal aortic aneurysm. Reproductive: No mass or other significant abnormality. Prostatic gland calcifications are noted, nonspecific finding. Other: Mild nonspecific mesenteric stranding. Musculoskeletal: No suspicious bone lesions identified. There are multilevel osteoarthritic changes of the thoracic and lumbosacral spine. Likely degenerative compression deformity with approximately 40% height loss of T12 vertebral body is seen. Milder inferior endplate compression deformity of L2 vertebral body is also noted. IMPRESSION: No evidence of small-bowel obstruction or significant inflammatory changes. Non specific mesenteric stranding. Left renal atrophy and bilateral perinephric stranding, consistent with the given history of chronic renal failure. Otherwise, no evidence of significant abnormalities of the solid abdominal organs, accounting for lack of IV contrast. Focal duodenal prominence likely represents 2 duodenal diverticula. Poorly evaluated peribronchovascular and subpleural interstitial thickening and reticulation in the lower lobes of the lungs. This may represent an element of chronic interstitial lung disease. Electronically Signed   By: Fidela Salisbury M.D.   On: 12/22/2015 14:58   Dg Elbow 2 Views Left  12/22/2015  CLINICAL DATA:  Left elbow pain, difficult to straighten. EXAM: LEFT ELBOW -  2 VIEW COMPARISON:  None. FINDINGS: An IV and associated Hep-Lock tubing project over the left elbow. There is a small elbow joint effusion, with the posterior fat pad minimally visible. Soft tissue swelling overlies the olecranon. There is mild spurring at the attachment site of the distal triceps. The supinator fat pad is indistinct and potentially effaced. There are some linear lucencies projecting along the radial head which are probably due to striations in the overlying soft tissue rather than the radial head fracture, although the supinator fat pad effacement can be associated with radial head fracture. No other significant bony findings. IMPRESSION: 1. Small elbow joint effusion with minimal visibility of the posterior fat pad along the bony margin. 2. Effaced supinator fat pad. This might be due to the underlying generalized edema in the subcutaneous tissues and within the region, but this can sometimes be associated with occult radial head fracture. 3. Soft tissue swelling overlying the olecranon, although this may be part of the generalized subcutaneous edema rather than necessarily indicating olecranon bursitis. Electronically Signed   By: Van Clines M.D.   On: 12/22/2015 12:13   US Renal  12/22/2015  CLINICAL DATA:  Acute renal failure of unknown etiology. EXAM: RENAL / URINARY TRACT ULTRASOUND COMPLETE COMPARISON:  Abdominal and pelvic noncontrast CT scan of December 22, 2015 FINDINGS: Right Kidney: Length: 12.9 cm. The renal cortical echotexture remains lower than that of the adjacent liver. There is no discrete mass. There is no hydronephrosis. Little vascular flow is observed with Doppler. Left Kidney: Length: 9.3 cm. There is cortical atrophy. The cortical echotexture is greater than that on the right. There is no hydronephrosis. Limited vascular flow is observed with Doppler. Bladder: Appears normal for degree of bladder distention. The prevoid volume is 406 cc. The postvoid volume is 127  cc. IMPRESSION: Parenchymal changes and atrophy consistent with  medical renal disease on the left. The renal size and echotexture on the right appears normal. However, vascular flow appears diminished bilaterally. Moderate-sized postvoid residual urinary bladder volume. Electronically Signed   By: David  Martinique M.D.   On: 12/22/2015 15:04    EKG:   Orders placed or performed during the hospital encounter of 12/19/2015  . ED EKG  . ED EKG  . EKG 12-Lead  . EKG 12-Lead  . EKG 12-Lead  . EKG 12-Lead    ASSESSMENT AND PLAN:   82 y.o. male with a PMHx of chronic atrial fibrillation, diabetes mellitus type 2, gout, hypertension, chronic kidney disease stage IV followed by [facility], who was admitted to [facility] on 01/03/2016 for evaluation of mental status in the setting of hypoglycemia.  1. Hypoglycemia with h/o DM- Likely poor PO intake and also with ARF as patient was on insulin ad amaryl and actos at home - now off of D10 drip, sugars elevated- started lantus at a lower dose  2. ARF on CKD stage 4- baseline gfr 20 per nephrology notes, now with fluids- improved to gfr 11 - cr improved to 4.4 - renal US with atrophic kidneys - no acute indication for dialysis now, but might be heading that way - appreciate nephrology consult - monitor BMP  3. Nausea/vomiting- CT abd with no obstruction ? Uremia with worsened renal function - Much improved, on protonix BID for now -tolerating diet now  4. Hyperkalemia- Improving with improving renal function  5. Left elbow pain- X ray with effusion, may be olecranon bursitis -Could be gout as well.  - Oral prednisone, add allopurinol - ortho consult appreciated. Patient refused any intervention.  6. Anemia of chronic disease- stable, monitor for now  7. Atrial fibrillation-on oral Cardizem. - hold off anticoagulation with warfarin due to falling at home. - appreciate cardiology consult Monitor for now  ECHO with normal LV function, elevated  troponin- likely from demand ischemia in the setting of hypoglycemia  8. DVT Prophylaxis- on SQ heparin   Physical therapy consult once more medically stable. Updated Son last night    All the records are reviewed and case discussed with Care Management/Social Workerr. Management plans discussed with the patient, family and they are in agreement.  CODE STATUS: Full Code  TOTAL TIME SPENT IN TAKING CARE OF THIS PATIENT: 37 minutes.   POSSIBLE D/C IN 3-4 DAYS, DEPENDING ON CLINICAL CONDITION.   Gladstone Lighter M.D on 12/24/2015 at 8:39 AM  Between 7am to 6pm - Pager - [phone]  After 6pm go to [email or web address] - password [email or web address] [facility]  [facility]  Office  [phone]  CC: Primary care physician; No primary care provider on file.

## 2015-12-24 NOTE — Progress Notes (Signed)
PT Cancellation Note  Patient Details Name: Miguel Guerrero MRN: [id number] DOB: [birth year]   Cancelled Treatment:     After performing thorough chart review, it was determined that rehabilitation is contraindicated at this point in time due to elevated glucose levels, >300. Will check back at later time to perform evaluation.   Dorice Lamas, PT, DPT 12/24/2015, 7:57 AM

## 2015-12-24 NOTE — Progress Notes (Signed)
Dr. Tressia Miners notified of CBG 409. Instructed to give 20 units novolog once instead of dinner time sliding scale.

## 2015-12-24 NOTE — Progress Notes (Signed)
Family at bedside wanting update from Dr. Tressia Miners. MD notified, will call into room.

## 2015-12-24 NOTE — Progress Notes (Signed)
Subjective: No new complaints pertaining to his left elbow.   Objective: Vital signs in last 24 hours: Temp:  [98 F (36.7 C)-99 F (37.2 C)] 98.3 F (36.8 C) (02/11 0426) Pulse Rate:  [82-115] 82 (02/11 0426) Resp:  [17-20] 18 (02/11 0426) BP: (103-154)/(53-73) 118/58 mmHg (02/11 0426) SpO2:  [92 %-100 %] 92 % (02/11 0426)  Intake/Output from previous day: 02/10 0701 - 02/11 0700 In: 1040 [P.O.:1040] Out: 1150 [Urine:1150] Intake/Output this shift: Total I/O In: -  Out: 300 [Urine:300]   Recent Labs  12/22/15 0643 12/23/15 0543  HGB 12.2* 11.5*    Recent Labs  12/22/15 0643 12/23/15 0543  WBC 12.7* 9.9  RBC 3.96* 3.69*  HCT 36.3* 33.8*  PLT 151 128*    Recent Labs  12/23/15 0543 12/24/15 0428  NA 130* 132*  K 4.7 5.0  CL 98* 98*  CO2 19* 23  BUN 114* 107*  CREATININE 4.73* 4.48*  GLUCOSE 326* 346*  CALCIUM 8.1* 8.5*   No results for input(s): LABPT, INR in the last 72 hours.  Physical Exam: Examination of the left elbow is unchanged as compared to yesterday. He again demonstrates mild to moderate focal tenderness to palpation over the posterior elbow region but is able to actively flex and extend his elbow from 20-110 without any discomfort. There is perhaps mild swelling in the posterior aspect of the elbow, but no erythema. He remains neurovascularly intact to the left upper extremity and hand.  Assessment: Probable sterile olecranon bursitis left elbow.  Plan: I feel it is reasonable to continue to manage his symptoms symptomatically, protecting the posterior aspect of the elbow from excessive friction or trauma with an elbow pad. He may gradually progress in his activities as symptoms permit.   Marshall Cork Calissa Swenor 12/24/2015, 8:07 AM

## 2015-12-24 NOTE — Progress Notes (Signed)
Dr. Tressia Miners aware of CBG 414,. Order to give 20 units of novolog now. MD to adjust sliding scale.

## 2015-12-24 NOTE — Consult Note (Signed)
[facility] [phone]  Fax:(336) [place] OB: August 19, 1933  MR#: [id number]  XNT#:[id number]  Patient Care Team: Roxana Hires, MD as Consulting Physician (Internal Medicine)  CHIEF COMPLAINT:  Chief Complaint  Patient presents with  . Hypoglycemia     No history exists.    Oncology Flowsheet 12/22/2015 12/22/2015 12/22/2015 12/22/2015 12/23/2015 12/23/2015 12/24/2015  ondansetron (ZOFRAN) IV 4 mg 4 mg 4 mg 4 mg 4 mg 4 mg -  predniSONE (DELTASONE) PO - - - - 50 mg   50 mg    HISTORY OF PRESENT ILLNESS:   Miguel Guerrero is an 82 year old gentleman with history of hypertension, diabetes and atrial fibrillation, who is admitted with hypoglycemia and altered mental status. He has chronic renal insufficiency, stage IV, and has been hold for renal specialists for a long time. The workup in the hospital revealed presence of a small monoclonal protein on spot urine protein electrophoresis. Serum protein electrophoresis showed no evidence of monoclonal protein.  REVIEW OF SYSTEMS:   Review of Systems  All other systems reviewed and are negative.    PAST MEDICAL HISTORY: Past Medical History  Diagnosis Date  . Chronic atrial fibrillation (HCC)     a. CHA2DS2-VASc Score is 4 (HTN, DM, Age (2)) On Coumadin in the past, not currently.  . Type 2 diabetes mellitus ([place])   . Gout   . HTN (hypertension)   . HLD (hyperlipidemia)   . ESRD (end stage renal disease) ([place])     PAST SURGICAL HISTORY: Past Surgical History  Procedure Laterality Date  . Cholecystectomy      FAMILY HISTORY Family History  Problem Relation Age of Onset  . Breast cancer Mother     ADVANCED DIRECTIVES:  No flowsheet data found.  HEALTH MAINTENANCE: Social History  Substance Use Topics  . Smoking status: Former Smoker    Quit date: 11/12/1968  . Smokeless tobacco: Never Used  . Alcohol Use: No     No Known Allergies  Current Facility-Administered Medications    Medication Dose Route Frequency Provider Last Rate Last Dose  . acetaminophen (TYLENOL) tablet 650 mg  650 mg Oral Q6H PRN Lance Coon, MD       Or  . acetaminophen (TYLENOL) suppository 650 mg  650 mg Rectal Q6H PRN Lance Coon, MD      . Derrill Memo ON 12/25/2015] allopurinol (ZYLOPRIM) tablet 150 mg  150 mg Oral Daily Gladstone Lighter, MD      . aspirin EC tablet 81 mg  81 mg Oral Daily Lance Coon, MD   81 mg at 12/24/15 0753  . atorvastatin (LIPITOR) tablet 40 mg  40 mg Oral q1800 Gladstone Lighter, MD   40 mg at 12/23/15 1711  . dextrose 50 % solution 50 mL  1 ampule Intravenous PRN Lance Coon, MD   25 mL at 12/21/15 0956  . diltiazem (CARDIZEM) tablet 60 mg  60 mg Oral 4 times per day Gladstone Lighter, MD   60 mg at 12/24/15 9675  . heparin injection 5,000 Units  5,000 Units Subcutaneous 3 times per day Lance Coon, MD   5,000 Units at 12/24/15 [phone]  . insulin aspart (novoLOG) injection 0-5 Units  0-5 Units Subcutaneous QHS Gladstone Lighter, MD   2 Units at 12/23/15 2144  . insulin aspart (novoLOG) injection 0-9 Units  0-9 Units Subcutaneous TID WC Gladstone Lighter, MD   7 Units at 12/24/15 0755  . insulin glargine (LANTUS) injection 30 Units  30 Units  Subcutaneous QHS Gladstone Lighter, MD      . metoprolol (LOPRESSOR) injection 5 mg  5 mg Intravenous Q6H PRN Gladstone Lighter, MD      . ondansetron [facility]) tablet 4 mg  4 mg Oral Q6H PRN Lance Coon, MD       Or  . ondansetron [facility]) injection 4 mg  4 mg Intravenous Q6H PRN Lance Coon, MD   4 mg at 12/22/15 1024  . pantoprazole (PROTONIX) EC tablet 40 mg  40 mg Oral BID AC Charlett Nose, RPH   40 mg at 12/24/15 0753  . predniSONE (DELTASONE) tablet 50 mg  50 mg Oral Daily Gladstone Lighter, MD   50 mg at 12/24/15 0755  . sodium bicarbonate 150 mEq in dextrose 5 % 1,000 mL infusion   Intravenous Continuous Theodoro Grist, MD 40 mL/hr at 12/23/15 1711    . sodium chloride flush (NS) 0.9 % injection 3 mL  3 mL Intravenous Q12H  Lance Coon, MD   3 mL at 12/24/15 0759    OBJECTIVE:  Filed Vitals:   12/24/15 0102 12/24/15 0426  BP: 107/53 118/58  Pulse: 88 82  Temp:  98.3 F (36.8 C)  Resp:  18     Body mass index is 31.29 kg/(m^2).    ECOG FS:4 - Bedbound  Physical Exam  Constitutional: He is oriented to person, place, and time and well-developed, well-nourished, and in no distress. No distress.  Obese elderly Court appearing Caucasian male  HENT:  Head: Normocephalic and atraumatic.  Right Ear: External ear normal.  Left Ear: External ear normal.  Nose: Nose normal.  Mouth/Throat: Oropharynx is clear and moist. No oropharyngeal exudate.  Eyes: Conjunctivae and EOM are normal. Pupils are equal, round, and reactive to light. Right eye exhibits no discharge. Left eye exhibits no discharge. No scleral icterus.  Neck: Normal range of motion. Neck supple. No JVD present. No tracheal deviation present. No thyromegaly present.  Cardiovascular: Normal rate, normal heart sounds and intact distal pulses.  Exam reveals no gallop and no friction rub.   No murmur heard. Irregular heart rate  Pulmonary/Chest: Effort normal and breath sounds normal. No stridor. No respiratory distress. He has no wheezes. He has no rales. He exhibits no tenderness.  Abdominal: Soft. Bowel sounds are normal. He exhibits no distension and no mass. There is no tenderness. There is no rebound and no guarding.  Genitourinary:  Patient deferred  Musculoskeletal: Normal range of motion. He exhibits no edema or tenderness.  Lymphadenopathy:    He has no cervical adenopathy.  Neurological: He is alert and oriented to person, place, and time. He has normal reflexes. No cranial nerve deficit. He exhibits normal muscle tone. Gait normal. Coordination normal. GCS score is 15.  Skin: Skin is warm and dry. No rash noted. He is not diaphoretic. No erythema. No pallor.  Psychiatric: Mood, memory, affect and judgment normal.  Vitals  reviewed.    LAB RESULTS:  Results for orders placed or performed during the hospital encounter of 12/25/2015 (from the past 72 hour(s))  Glucose, capillary     Status: None   Collection Time: 12/21/15 11:06 AM  Result Value Ref Range   Glucose-Capillary 83 65 - 99 mg/dL   Comment 1 Notify RN   Glucose, capillary     Status: None   Collection Time: 12/21/15 12:56 PM  Result Value Ref Range   Glucose-Capillary 95 65 - 99 mg/dL  Troponin I     Status: Abnormal  Collection Time: 12/21/15  2:31 PM  Result Value Ref Range   Troponin I 0.16 (H) <0.031 ng/mL    Comment: PREVIOUS RESULT CALLED AT 0348  12/21/15 [place].Marland KitchenMarland KitchenSDR        PERSISTENTLY INCREASED TROPONIN VALUES IN THE RANGE OF 0.04-0.49 ng/mL CAN BE SEEN IN:       -UNSTABLE ANGINA       -CONGESTIVE HEART FAILURE       -MYOCARDITIS       -CHEST TRAUMA       -ARRYHTHMIAS       -LATE PRESENTING MYOCARDIAL INFARCTION       -COPD   CLINICAL FOLLOW-UP RECOMMENDED.   Potassium     Status: Abnormal   Collection Time: 12/21/15  2:31 PM  Result Value Ref Range   Potassium 5.5 (H) 3.5 - 5.1 mmol/L  Glucose, capillary     Status: None   Collection Time: 12/21/15  3:14 PM  Result Value Ref Range   Glucose-Capillary 87 65 - 99 mg/dL  Glucose, capillary     Status: None   Collection Time: 12/21/15  5:01 PM  Result Value Ref Range   Glucose-Capillary 87 65 - 99 mg/dL  Protein electrophoresis, serum     Status: Abnormal   Collection Time: 12/21/15  5:03 PM  Result Value Ref Range   Total Protein ELP 5.6 (L) 6.0 - 8.5 g/dL   Albumin ELP 2.8 (L) 2.9 - 4.4 g/dL   Alpha-1-Globulin 0.2 0.0 - 0.4 g/dL   Alpha-2-Globulin 1.1 (H) 0.4 - 1.0 g/dL   Beta Globulin 1.0 0.7 - 1.3 g/dL   Gamma Globulin 0.5 0.4 - 1.8 g/dL   M-Spike, % Not Observed Not Observed g/dL   SPE Interp. Comment     Comment: (NOTE) The SPE pattern is suggestive of a subacute inflammatory response. This condition represents an intermediate stage between two possible  courses for acute inflammation: total convalescense with a return to normal, or the onset of a chronic inflammatory condition. Performed At: [facility] [place], [place] [id number] Lindon Romp MD [id number]    Comment Comment     Comment: (NOTE) Protein electrophoresis scan will follow via computer, mail, or courier delivery.    GLOBULIN, TOTAL 2.8 2.2 - 3.9 g/dL   A/G Ratio 1.0 0.7 - 1.7  Mpo/pr-3 (anca) antibodies     Status: None   Collection Time: 12/21/15  5:03 PM  Result Value Ref Range   Myeloperoxidase Abs <9.0 0.0 - 9.0 U/mL   ANCA Proteinase 3 <3.5 0.0 - 3.5 U/mL    Comment: (NOTE) Performed At: [facility] [place], [place] [id number] Lindon Romp MD KM:[id number]   Parathyroid hormone, intact (no Ca)     Status: Abnormal   Collection Time: 12/21/15  5:03 PM  Result Value Ref Range   PTH 119 (H) 15 - 65 pg/mL    Comment: (NOTE) Performed At: [facility] [address] [place], [place] [id number] Lindon Romp MD [id number]   VITAMIN D 25 Hydroxy (Vit-D Deficiency, Fractures)     Status: None   Collection Time: 12/21/15  5:03 PM  Result Value Ref Range   Vit D, 25-Hydroxy 43.6 30.0 - 100.0 ng/mL    Comment: (NOTE) Vitamin D deficiency has been defined by the Welch practice guideline as a level of serum 25-OH vitamin D less than 20 ng/mL (1,2). The Endocrine Society went on to further define vitamin D  insufficiency as a level between 21 and 29 ng/mL (2). 1. IOM (Institute of Medicine). 2010. Dietary reference   intakes for calcium and D. [place]: The   [employer]. 2. Holick MF, [place], Bischoff-Ferrari HA, et al.   Evaluation, treatment, and prevention of vitamin D   deficiency: an Endocrine Society clinical practice   guideline. JCEM. 2011 Jul; 96(7):1911-30. Performed At: [facility] [place], [place] [id number] Lindon Romp MD [id number]   C4 complement     Status: None   Collection Time: 12/21/15  5:03 PM  Result Value Ref Range   Complement C4, Body Fluid 36 14 - 44 mg/dL    Comment: (NOTE) Performed At: [facility] [place], [place] [id number] Lindon Romp MD [id number]   C3 complement     Status: None   Collection Time: 12/21/15  5:03 PM  Result Value Ref Range   C3 Complement 134 82 - 167 mg/dL    Comment: (NOTE) Performed At: [facility] [place], [place] [id number] Lindon Romp MD [id number]   ANA w/Reflex if Positive     Status: None   Collection Time: 12/21/15  5:03 PM  Result Value Ref Range   Anit Nuclear Antibody(ANA) Negative Negative    Comment: (NOTE) Performed At: [facility] [place], [place] [id number] Lindon Romp MD [id number]   Iron and TIBC     Status: Abnormal   Collection Time: 12/21/15  5:03 PM  Result Value Ref Range   Iron 54 45 - 182 ug/dL   TIBC 236 (L) 250 - 450 ug/dL   Saturation Ratios 23 17.9 - 39.5 %   UIBC 182 ug/dL  Ferritin     Status: Abnormal   Collection Time: 12/21/15  5:03 PM  Result Value Ref Range   Ferritin 617 (H) 24 - 336 ng/mL  Protein Electro, Random Urine     Status: Abnormal   Collection Time: 12/21/15  5:49 PM  Result Value Ref Range   Total Protein, Urine 4.0 Not Estab. mg/dL   Albumin ELP, Urine 23.9 %   Alpha-1-Globulin, U 1.2 %   Alpha-2-Globulin, U 17.5 %   Beta Globulin, U 41.7 %   Gamma Globulin, U 15.8 %   M Component, Ur 15.8 (H) Not Observed %   PLEASE NOTE: Comment     Comment: (NOTE) Protein electrophoresis scan will follow via computer, mail, or courier delivery. Performed At: [facility] [place], [place] [id number] Lindon Romp MD [id number]   MRSA PCR Screening     Status: None   Collection Time: 12/21/15  5:51 PM  Result Value Ref Range   MRSA  by PCR NEGATIVE NEGATIVE    Comment:        The GeneXpert MRSA Assay (FDA approved for NASAL specimens only), is one component of a comprehensive MRSA colonization surveillance program. It is not intended to diagnose MRSA infection nor to guide or monitor treatment for MRSA infections.   Glucose, capillary     Status: None   Collection Time: 12/21/15  9:36 PM  Result Value Ref Range   Glucose-Capillary 67 65 - 99 mg/dL  Glucose, capillary     Status: None   Collection Time: 12/21/15 10:33 PM  Result Value Ref Range   Glucose-Capillary 86 65 - 99 mg/dL  Glucose, capillary     Status: None   Collection Time: 12/22/15 12:04 AM  Result Value Ref Range   Glucose-Capillary 85 65 - 99 mg/dL  Glucose, capillary     Status: None   Collection Time: 12/22/15  3:09 AM  Result Value Ref Range   Glucose-Capillary 75 65 - 99 mg/dL  Glucose, capillary     Status: Abnormal   Collection Time: 12/22/15  4:33 AM  Result Value Ref Range   Glucose-Capillary 101 (H) 65 - 99 mg/dL  Glucose, capillary     Status: None   Collection Time: 12/22/15  5:58 AM  Result Value Ref Range   Glucose-Capillary 96 65 - 99 mg/dL  Basic metabolic panel     Status: Abnormal   Collection Time: 12/22/15  6:43 AM  Result Value Ref Range   Sodium 134 (L) 135 - 145 mmol/L   Potassium 5.2 (H) 3.5 - 5.1 mmol/L   Chloride 100 (L) 101 - 111 mmol/L   CO2 23 22 - 32 mmol/L   Glucose, Bld 86 65 - 99 mg/dL   BUN 100 (H) 6 - 20 mg/dL   Creatinine, Ser 5.40 (H) 0.61 - 1.24 mg/dL   Calcium 8.3 (L) 8.9 - 10.3 mg/dL   GFR calc non Af Amer 9 (L) >60 mL/min   GFR calc Af Amer 10 (L) >60 mL/min    Comment: (NOTE) The eGFR has been calculated using the CKD EPI equation. This calculation has not been validated in all clinical situations. eGFR's persistently <60 mL/min signify possible Chronic Kidney Disease.    Anion gap 11 5 - 15  CBC     Status: Abnormal   Collection Time: 12/22/15  6:43 AM  Result Value Ref Range   WBC  12.7 (H) 3.8 - 10.6 K/uL   RBC 3.96 (L) 4.40 - 5.90 MIL/uL   Hemoglobin 12.2 (L) 13.0 - 18.0 g/dL   HCT 36.3 (L) 40.0 - 52.0 %   MCV 91.7 80.0 - 100.0 fL   MCH 30.9 26.0 - 34.0 pg   MCHC 33.7 32.0 - 36.0 g/dL   RDW 15.4 (H) 11.5 - 14.5 %   Platelets 151 150 - 440 K/uL  Glucose, capillary     Status: None   Collection Time: 12/22/15  7:58 AM  Result Value Ref Range   Glucose-Capillary 91 65 - 99 mg/dL  Glucose, capillary     Status: None   Collection Time: 12/22/15  9:46 AM  Result Value Ref Range   Glucose-Capillary 77 65 - 99 mg/dL  Glucose, capillary     Status: None   Collection Time: 12/22/15 11:55 AM  Result Value Ref Range   Glucose-Capillary 92 65 - 99 mg/dL  Glucose, capillary     Status: None   Collection Time: 12/22/15  2:00 PM  Result Value Ref Range   Glucose-Capillary 68 65 - 99 mg/dL  Glucose, capillary     Status: Abnormal   Collection Time: 12/22/15  4:03 PM  Result Value Ref Range   Glucose-Capillary 112 (H) 65 - 99 mg/dL  Glucose, capillary     Status: Abnormal   Collection Time: 12/22/15  6:26 PM  Result Value Ref Range   Glucose-Capillary 128 (H) 65 - 99 mg/dL  Glucose, capillary     Status: Abnormal   Collection Time: 12/22/15  8:16 PM  Result Value Ref Range   Glucose-Capillary 166 (H) 65 - 99 mg/dL  Glucose, capillary     Status: Abnormal   Collection Time: 12/22/15 10:04 PM  Result Value Ref Range   Glucose-Capillary 177 (H) 65 -  99 mg/dL  Glucose, capillary     Status: Abnormal   Collection Time: 12/23/15  2:31 AM  Result Value Ref Range   Glucose-Capillary 260 (H) 65 - 99 mg/dL  Glucose, capillary     Status: Abnormal   Collection Time: 12/23/15  4:04 AM  Result Value Ref Range   Glucose-Capillary 283 (H) 65 - 99 mg/dL  CBC     Status: Abnormal   Collection Time: 12/23/15  5:43 AM  Result Value Ref Range   WBC 9.9 3.8 - 10.6 K/uL    Comment: WHITE COUNT CONFIRMED ON SMEAR   RBC 3.69 (L) 4.40 - 5.90 MIL/uL   Hemoglobin 11.5 (L) 13.0 -  18.0 g/dL   HCT 33.8 (L) 40.0 - 52.0 %   MCV 91.5 80.0 - 100.0 fL   MCH 31.1 26.0 - 34.0 pg   MCHC 34.0 32.0 - 36.0 g/dL   RDW 14.5 11.5 - 14.5 %   Platelets 128 (L) 150 - 440 K/uL  Basic metabolic panel     Status: Abnormal   Collection Time: 12/23/15  5:43 AM  Result Value Ref Range   Sodium 130 (L) 135 - 145 mmol/L   Potassium 4.7 3.5 - 5.1 mmol/L   Chloride 98 (L) 101 - 111 mmol/L   CO2 19 (L) 22 - 32 mmol/L   Glucose, Bld 326 (H) 65 - 99 mg/dL   BUN 114 (H) 6 - 20 mg/dL   Creatinine, Ser 4.73 (H) 0.61 - 1.24 mg/dL   Calcium 8.1 (L) 8.9 - 10.3 mg/dL   GFR calc non Af Amer 10 (L) >60 mL/min   GFR calc Af Amer 12 (L) >60 mL/min    Comment: (NOTE) The eGFR has been calculated using the CKD EPI equation. This calculation has not been validated in all clinical situations. eGFR's persistently <60 mL/min signify possible Chronic Kidney Disease.    Anion gap 13 5 - 15  Glucose, capillary     Status: Abnormal   Collection Time: 12/23/15  6:29 AM  Result Value Ref Range   Glucose-Capillary 356 (H) 65 - 99 mg/dL  Glucose, capillary     Status: Abnormal   Collection Time: 12/23/15  8:07 AM  Result Value Ref Range   Glucose-Capillary 324 (H) 65 - 99 mg/dL  Glucose, capillary     Status: Abnormal   Collection Time: 12/23/15 11:20 AM  Result Value Ref Range   Glucose-Capillary 376 (H) 65 - 99 mg/dL  Glucose, capillary     Status: Abnormal   Collection Time: 12/23/15  4:24 PM  Result Value Ref Range   Glucose-Capillary 260 (H) 65 - 99 mg/dL   Comment 1 Notify RN   Glucose, capillary     Status: Abnormal   Collection Time: 12/23/15  8:52 PM  Result Value Ref Range   Glucose-Capillary 237 (H) 65 - 99 mg/dL  Basic metabolic panel     Status: Abnormal   Collection Time: 12/24/15  4:28 AM  Result Value Ref Range   Sodium 132 (L) 135 - 145 mmol/L   Potassium 5.0 3.5 - 5.1 mmol/L   Chloride 98 (L) 101 - 111 mmol/L   CO2 23 22 - 32 mmol/L   Glucose, Bld 346 (H) 65 - 99 mg/dL   BUN  107 (H) 6 - 20 mg/dL   Creatinine, Ser 4.48 (H) 0.61 - 1.24 mg/dL   Calcium 8.5 (L) 8.9 - 10.3 mg/dL   GFR calc non Af Amer 11 (L) >60 mL/min  GFR calc Af Amer 13 (L) >60 mL/min    Comment: (NOTE) The eGFR has been calculated using the CKD EPI equation. This calculation has not been validated in all clinical situations. eGFR's persistently <60 mL/min signify possible Chronic Kidney Disease.    Anion gap 11 5 - 15  Glucose, capillary     Status: Abnormal   Collection Time: 12/24/15  7:27 AM  Result Value Ref Range   Glucose-Capillary 343 (H) 65 - 99 mg/dL   Comment 1 Notify RN      Admission on 01/09/2016  No results displayed because visit has over 200 results.      STUDIES: Ct Abdomen Pelvis Wo Contrast  12/22/2015  CLINICAL DATA:  Foul-smelling vomitus. Diarrhea and poor appetite. History of chronic renal disease. EXAM: CT ABDOMEN AND PELVIS WITHOUT CONTRAST TECHNIQUE: Multidetector CT imaging of the abdomen and pelvis was performed following the standard protocol without IV contrast. COMPARISON:  None. FINDINGS: Lower chest: Nonspecific peribronchovascular and subpleural interstitial thickening and reticulation, incompletely evaluated due to motion artifact. There is calcified atherosclerotic disease of the coronary arteries. Aortic valve annulus calcifications are also seen. Hepatobiliary: No mass visualized on this un-enhanced exam. Sub cm right hepatic dome cyst. Status post prior cholecystectomy. Pancreas: No mass or inflammatory process identified on this un-enhanced exam. Spleen: Within normal limits in size. Adrenals/Urinary Tract: There is left renal atrophy and bilateral perirenal fat stranding. Stomach/Bowel: No evidence of obstruction, inflammatory process, or abnormal fluid collections. There are 2 probable duodenal diverticula (image 42 sequence 2) cysts. Vascular/Lymphatic: No pathologically enlarged lymph nodes. No evidence of abdominal aortic aneurysm. Reproductive: No  mass or other significant abnormality. Prostatic gland calcifications are noted, nonspecific finding. Other: Mild nonspecific mesenteric stranding. Musculoskeletal: No suspicious bone lesions identified. There are multilevel osteoarthritic changes of the thoracic and lumbosacral spine. Likely degenerative compression deformity with approximately 40% height loss of T12 vertebral body is seen. Milder inferior endplate compression deformity of L2 vertebral body is also noted. IMPRESSION: No evidence of small-bowel obstruction or significant inflammatory changes. Non specific mesenteric stranding. Left renal atrophy and bilateral perinephric stranding, consistent with the given history of chronic renal failure. Otherwise, no evidence of significant abnormalities of the solid abdominal organs, accounting for lack of IV contrast. Focal duodenal prominence likely represents 2 duodenal diverticula. Poorly evaluated peribronchovascular and subpleural interstitial thickening and reticulation in the lower lobes of the lungs. This may represent an element of chronic interstitial lung disease. Electronically Signed   By: Fidela Salisbury M.D.   On: 12/22/2015 14:58   Dg Chest 2 View  12/29/2015  CLINICAL DATA:  Repeated episodes of hypoglycemia. EXAM: CHEST  2 VIEW COMPARISON:  None. FINDINGS: Cardiomegaly. Mild vascular congestion. RIGHT pleural effusion. No overt failure or focal infiltrates. No areas of significant atelectasis. Indeterminate age lower thoracic compression deformity. Osteopenia. IMPRESSION: Cardiomegaly with mild vascular congestion. RIGHT pleural effusion. No overt failure or infiltrates. Electronically Signed   By: Staci Righter M.D.   On: 12/31/2015 19:31   Dg Elbow 2 Views Left  12/22/2015  CLINICAL DATA:  Left elbow pain, difficult to straighten. EXAM: LEFT ELBOW - 2 VIEW COMPARISON:  None. FINDINGS: An IV and associated Hep-Lock tubing project over the left elbow. There is a small elbow joint  effusion, with the posterior fat pad minimally visible. Soft tissue swelling overlies the olecranon. There is mild spurring at the attachment site of the distal triceps. The supinator fat pad is indistinct and potentially effaced. There are some linear lucencies  projecting along the radial head which are probably due to striations in the overlying soft tissue rather than the radial head fracture, although the supinator fat pad effacement can be associated with radial head fracture. No other significant bony findings. IMPRESSION: 1. Small elbow joint effusion with minimal visibility of the posterior fat pad along the bony margin. 2. Effaced supinator fat pad. This might be due to the underlying generalized edema in the subcutaneous tissues and within the region, but this can sometimes be associated with occult radial head fracture. 3. Soft tissue swelling overlying the olecranon, although this may be part of the generalized subcutaneous edema rather than necessarily indicating olecranon bursitis. Electronically Signed   By: Van Clines M.D.   On: 12/22/2015 12:13   US Renal  12/22/2015  CLINICAL DATA:  Acute renal failure of unknown etiology. EXAM: RENAL / URINARY TRACT ULTRASOUND COMPLETE COMPARISON:  Abdominal and pelvic noncontrast CT scan of December 22, 2015 FINDINGS: Right Kidney: Length: 12.9 cm. The renal cortical echotexture remains lower than that of the adjacent liver. There is no discrete mass. There is no hydronephrosis. Little vascular flow is observed with Doppler. Left Kidney: Length: 9.3 cm. There is cortical atrophy. The cortical echotexture is greater than that on the right. There is no hydronephrosis. Limited vascular flow is observed with Doppler. Bladder: Appears normal for degree of bladder distention. The prevoid volume is 406 cc. The postvoid volume is 127 cc. IMPRESSION: Parenchymal changes and atrophy consistent with medical renal disease on the left. The renal size and echotexture  on the right appears normal. However, vascular flow appears diminished bilaterally. Moderate-sized postvoid residual urinary bladder volume. Electronically Signed   By: David  Martinique M.D.   On: 12/22/2015 15:04    ASSESSMENT and MEDICAL DECISION MAKING:  Concern for plasma cell dyscrasia-abnormality of urine protein electrophoresis is fairly common in the setting of severe renal insufficiency. We requested urine protein immunofixation to be performed to better evaluate the nature of a suspected urine monoclonal protein. Serum protein electrophoresis showed no evidence of monoclonal protein, however, we requested serum free light chain assay to be performed. Once the results are available, we will follow with additional recommendations. Other hematologic parameters appear to be within acceptable range, not necessitating any invasive workup.  Thrombocytopenia-mild, no urgent intervention is needed. We'll continue to monitor.   No matching staging information was found for the patient.  Roxana Hires, MD   12/24/2015 9:02 AM

## 2015-12-25 LAB — BASIC METABOLIC PANEL
Anion gap: 10 (ref 5–15)
BUN: 138 mg/dL — ABNORMAL HIGH (ref 6–20)
CALCIUM: 8.3 mg/dL — AB (ref 8.9–10.3)
CHLORIDE: 95 mmol/L — AB (ref 101–111)
CO2: 26 mmol/L (ref 22–32)
CREATININE: 4.19 mg/dL — AB (ref 0.61–1.24)
GFR calc non Af Amer: 12 mL/min — ABNORMAL LOW (ref >60)
GFR, EST AFRICAN AMERICAN: 14 mL/min — AB (ref >60)
Glucose, Bld: 349 mg/dL — ABNORMAL HIGH (ref 65–99)
Potassium: 4.6 mmol/L (ref 3.5–5.1)
SODIUM: 131 mmol/L — AB (ref 135–145)

## 2015-12-25 LAB — GLUCOSE, CAPILLARY
GLUCOSE-CAPILLARY: 295 mg/dL — AB (ref 65–99)
GLUCOSE-CAPILLARY: 255 mg/dL — AB (ref 65–99)
Glucose-Capillary: 250 mg/dL — ABNORMAL HIGH (ref 65–99)
Glucose-Capillary: 289 mg/dL — ABNORMAL HIGH (ref 65–99)

## 2015-12-25 MED ORDER — SODIUM CHLORIDE 0.9 % IV SOLN
INTRAVENOUS | Status: DC
  Administered 2015-12-25: 12:00:00 via INTRAVENOUS

## 2015-12-25 MED ORDER — INSULIN ASPART 100 UNIT/ML ~~LOC~~ SOLN
7.0000 [IU] | Freq: Three times a day (TID) | SUBCUTANEOUS | Status: DC
  Administered 2015-12-25: 7 [IU] via SUBCUTANEOUS
  Filled 2015-12-25: qty 7

## 2015-12-25 MED ORDER — INSULIN GLARGINE 100 UNIT/ML ~~LOC~~ SOLN
45.0000 [IU] | Freq: Every day | SUBCUTANEOUS | Status: DC
  Administered 2015-12-25: 45 [IU] via SUBCUTANEOUS
  Filled 2015-12-25 (×2): qty 0.45

## 2015-12-25 NOTE — Care Management (Signed)
Physical therapy has recommended skilled nursing placement.  Patient's wife is current patient at [facility]

## 2015-12-25 NOTE — Progress Notes (Signed)
[place] [facility] [place] NAME: Miguel Guerrero    MR#:  [id number]  DATE OF BIRTH:  1933/06/01  SUBJECTIVE:  CHIEF COMPLAINT:   Chief Complaint  Patient presents with  . Hypoglycemia   - Renal function slowly improving. Baseline GFR is around 19. Now at 98 -Patient is more alert. sugars are elevated now - Physical therapy consult is pending  REVIEW OF SYSTEMS:  Review of Systems  Constitutional: Negative for fever and chills.  HENT: Negative for ear discharge and ear pain.   Eyes: Negative for blurred vision.  Respiratory: Negative for cough, shortness of breath and wheezing.   Cardiovascular: Negative for chest pain and palpitations.  Gastrointestinal: Negative for nausea, vomiting, abdominal pain, diarrhea and constipation.  Genitourinary: Negative for dysuria.  Musculoskeletal: Positive for myalgias and joint pain.       Left elbow pain and swelling Right knee pain  Neurological: Positive for weakness. Negative for dizziness, speech change, focal weakness, seizures and headaches.  Psychiatric/Behavioral: Negative for depression.    DRUG ALLERGIES:  No Known Allergies  VITALS:  Blood pressure 125/53, pulse 121, temperature 97.8 F (36.6 C), temperature source Oral, resp. rate 18, height 6\' 1"  (1.854 m), weight 107.548 kg (237 lb 1.6 oz), SpO2 97 %.  PHYSICAL EXAMINATION:  Physical Exam  GENERAL:  82 y.o.-year-old patient lying in the bed with no acute distress.  EYES: Pupils equal, round, reactive to light and accommodation. No scleral icterus. Extraocular muscles intact.  HEENT: Head atraumatic, normocephalic. Oropharynx and nasopharynx clear.  NECK:  Supple, no jugular venous distention. No thyroid enlargement, no tenderness.  LUNGS: Normal breath sounds bilaterally, no wheezing, rales,rhonchi or crepitation. No use of accessory muscles of respiration. Decreased bibasilar breath sounds CARDIOVASCULAR: S1, S2 normal. No  rubs, or gallops. 3/6 systolic murmur present ABDOMEN: Soft, nontender, nondistended. Bowel sounds present. No organomegaly or mass.  EXTREMITIES: No cyanosis, or clubbing. 1+ extremity edema NEUROLOGIC: Cranial nerves II through XII are intact. Left elbow pain and swelling. Sensation intact. Gait not checked. Right knee tenderness anteriorly, no significant swelling or erythema noted. PSYCHIATRIC: The patient is alert and oriented x 2-3.  SKIN: No obvious rash, lesion, or ulcer.    LABORATORY PANEL:   CBC  Recent Labs Lab 12/23/15 0543  WBC 9.9  HGB 11.5*  HCT 33.8*  PLT 128*   ------------------------------------------------------------------------------------------------------------------  Chemistries   Recent Labs Lab 12/21/15 1015  12/25/15 0440  NA  --   < > 131*  K  --   < > 4.6  CL  --   < > 95*  CO2  --   < > 26  GLUCOSE  --   < > 349*  BUN  --   < > 138*  CREATININE  --   < > 4.19*  CALCIUM  --   < > 8.3*  MG 2.0  --   --   < > = values in this interval not displayed. ------------------------------------------------------------------------------------------------------------------  Cardiac Enzymes  Recent Labs Lab 12/21/15 1431  TROPONINI 0.16*   ------------------------------------------------------------------------------------------------------------------  RADIOLOGY:  No results found.  EKG:   Orders placed or performed during the hospital encounter of 12/30/2015  . ED EKG  . ED EKG  . EKG 12-Lead  . EKG 12-Lead  . EKG 12-Lead  . EKG 12-Lead    ASSESSMENT AND PLAN:   82 y.o. male with a PMHx of chronic atrial fibrillation, diabetes mellitus type 2, gout, hypertension, chronic kidney disease  stage IV followed by [facility], who was admitted to [facility] on 01/04/2016 for evaluation of mental status in the setting of hypoglycemia.  1. Hypoglycemia with h/o DM- Likely poor PO intake and also with ARF as patient was on insulin ad amaryl and  actos at home - now off of D10 drip, sugars elevated- started lantus at a lower dose  2. ARF on CKD stage 4- baseline gfr 19-20 per nephrology notes, now with fluids- improved to gfr 11 - cr improved to 4.1 - renal US with atrophic kidneys - no acute indication for dialysis now, but might be heading that way-monitor every day - appreciate nephrology consult - monitor BMP  3. Nausea/vomiting- CT abd with no obstruction ? Uremia with worsened renal function - Much improved, on protonix BID for now -tolerating diet now  4. Hyperkalemia- Improving with improving renal function  5. Left elbow pain- X ray with effusion, may be olecranon bursitis -Could be gout as well.  - Oral prednisone, added allopurinol-significant improvement - ortho consult appreciated.  -To work with physical therapy today  6. Anemia of chronic disease- stable, monitor for now  7. Atrial fibrillation-on oral Cardizem. - hold off anticoagulation with warfarin due to falling at home. - appreciate cardiology consult Monitor for now  ECHO with normal LV function, elevated troponin- likely from demand ischemia in the setting of hypoglycemia  8. DVT Prophylaxis- on SQ heparin   Physical therapy consult once more medically stable. Updated Son yesterday.    All the records are reviewed and case discussed with Care Management/Social Workerr. Management plans discussed with the patient, family and they are in agreement.  CODE STATUS: Full Code  TOTAL TIME SPENT IN TAKING CARE OF THIS PATIENT: 37 minutes.   POSSIBLE D/C IN 1-2 DAYS, DEPENDING ON CLINICAL CONDITION.   Gladstone Lighter M.D on 12/25/2015 at 12:10 PM  Between 7am to 6pm - Pager - [phone]  After 6pm go to [email or web address] - password [email or web address] [facility]  [facility]  Office  [phone]  CC: Primary care physician; No primary care provider on file.

## 2015-12-25 NOTE — Evaluation (Signed)
Physical Therapy Evaluation Patient Details Name: Miguel Guerrero MRN: [id number] DOB: Jul 16, 1933 Today's Date: 12/25/2015   History of Present Illness  82 yo Male was brought to ED with hypoglycemia and confusion. patient was at home and son found him on the floor. He was brought to ED with abnormal labs on 2/7; He was diagnosed with ESRD stage IV not on dialysis.   Clinical Impression  82 yo Male brought to ED with confusion and was diagnosed with hypoglycemia and ESRD; Patient was independent in most ADLs prior to admittance. He was living at home with his wife. His wife is currently at [facility] getting rehab for s/p hip surgery after a fall at home. His son/daughter help with meals and housecleaning. Patient was walking without AD; Currently he is supervision for bed mobility, min A for sit<>Stand transfers and mod A for short distance gait tasks. Patient very unsteady upon standing without AD requiring 1 HHA with wide base of support. He would benefit from gait trial with RW; Patient also slow with LE movement having difficulty with LE exercise. He would benefit from additional skilled PT intervention to improve LE strength, balance and gait safety; Patient's clinical presentation is evolving as he has uncontrolled diabetes with glucose levels that vary significantly and has new onset significant LE weakness.     Follow Up Recommendations SNF    Equipment Recommendations  None recommended by PT (at this time; to be determined at [facility])    Recommendations for Other Services       Precautions / Restrictions Precautions Precautions: Fall Restrictions Weight Bearing Restrictions: No      Mobility  Bed Mobility Overal bed mobility: Needs Assistance Bed Mobility: Supine to Sit     Supine to sit: Supervision;HOB elevated     General bed mobility comments: very slow with min VCs for hand placement;   Transfers Overall transfer level: Needs assistance   Transfers: Sit to/from Stand Sit  to Stand: Min assist         General transfer comment: used IV pole; unsteady with mod VCs to increase erect posture for better balance;   Ambulation/Gait Ambulation/Gait assistance: Mod assist Ambulation Distance (Feet): 4 Feet Assistive device: None Gait Pattern/deviations: Step-to pattern;Decreased step length - right;Decreased step length - left;Decreased dorsiflexion - right;Decreased dorsiflexion - left;Wide base of support;Trunk flexed Gait velocity: decreased   General Gait Details: used IV pole and wall; patient unsteady when stepping to bedside chair; would benefit from gait trial with RW;   Stairs            Wheelchair Mobility    Modified Rankin (Stroke Patients Only)       Balance Overall balance assessment: Needs assistance Sitting-balance support: Single extremity supported Sitting balance-Leahy Scale: Good Sitting balance - Comments: able to keep balance statically sitting edge of bed; demonstrates posterior trunk lean with dynamic movement (fair)   Standing balance support: Single extremity supported Standing balance-Leahy Scale: Fair Standing balance comment: Min A for standing statically with 1 HHA; dynamic standing balance is poor;                              Pertinent Vitals/Pain Pain Assessment: No/denies pain    Home Living Family/patient expects to be discharged to:: Private residence Living Arrangements: Spouse/significant other (however wife is at [facility] s/p hip surgery) Available Help at Discharge: Family;Available PRN/intermittently (wife at [facility] place) Type of Home: House Home Access: Stairs to enter Entrance  Stairs-Rails: Right Entrance Stairs-Number of Steps: 6 Home Layout: One level Home Equipment: Walker - 4 wheels;Shower seat      Prior Function Level of Independence: Independent         Comments: was walking without AD; son/daughter would bring in food and help with household chores;      Hand  Dominance        Extremity/Trunk Assessment   Upper Extremity Assessment: Overall WFL for tasks assessed           Lower Extremity Assessment: RLE deficits/detail;LLE deficits/detail RLE Deficits / Details: grossly 3-/5; decreased ROM against gravity due to weakness; intact light touch sensation;  LLE Deficits / Details: grossly 3-/5; decreased ROM against gravity due to weakness; intact light touch sensation;   Cervical / Trunk Assessment: Kyphotic  Communication   Communication: HOH  Cognition Arousal/Alertness: Awake/alert Behavior During Therapy: WFL for tasks assessed/performed Overall Cognitive Status: Within Functional Limits for tasks assessed                      General Comments General comments (skin integrity, edema, etc.): intact by gross assessment;     Exercises Other Exercises Other Exercises: Instructed patient in BLE strengthening: sitting in chair: alternate march x10, LAQ x10 with mod VCS to increase ROMfor better strengthening; Patient had difficulty initiating LE movement requiring increased cues for motivation to continue (x8 min) patient very slow with exercise;       Assessment/Plan    PT Assessment Patient needs continued PT services  PT Diagnosis Difficulty walking;Generalized weakness;Abnormality of gait   PT Problem List Decreased strength;Obesity;Decreased safety awareness;Decreased activity tolerance;Decreased balance;Decreased knowledge of use of DME;Decreased mobility  PT Treatment Interventions DME instruction;Balance training;Gait training;Neuromuscular re-education;Stair training;Functional mobility training;Patient/family education;Therapeutic activities;Therapeutic exercise   PT Goals (Current goals can be found in the Care Plan section) Acute Rehab PT Goals Patient Stated Goal: "I want to go home" PT Goal Formulation: With patient Time For Goal Achievement: 01/08/16 Potential to Achieve Goals: Fair    Frequency Min  2X/week   Barriers to discharge Inaccessible home environment;Decreased caregiver support has stairs to enter and wife is currently at [facility] place getting rehab for s/p hip surgery;     Co-evaluation               End of Session Equipment Utilized During Treatment: Gait belt Activity Tolerance: Patient tolerated treatment well;Patient limited by fatigue Patient left: in chair;with call bell/phone within reach;with chair alarm set;with family/visitor present Nurse Communication: Mobility status         Time: 1330-1355 PT Time Calculation (min) (ACUTE ONLY): 25 min   Charges:   PT Evaluation $PT Eval Moderate Complexity: 1 Procedure PT Treatments $Therapeutic Exercise: 8-22 mins   PT G Codes:        Dontaye Hur PT, DPT 12/25/2015, 3:54 PM

## 2015-12-25 NOTE — Progress Notes (Signed)
[facility]  ROUNDING NOTE   Subjective:  Creatinine continues to improve and is currently down to 4.19. However BUN has been fluctuating and is currently 138. Urine output was found to be 1.5 L over the preceding 24 hours.   Objective:  Vital signs in last 24 hours:  Temp:  [97.8 F (36.6 C)-98.6 F (37 C)] 97.8 F (36.6 C) (02/12 1117) Pulse Rate:  [75-121] 121 (02/12 1117) Resp:  [18-20] 18 (02/12 1117) BP: (117-136)/(52-61) 125/53 mmHg (02/12 1117) SpO2:  [94 %-97 %] 97 % (02/12 1117)  Weight change:  Filed Weights   12/17/2015 1841 12/19/2015 2300  Weight: 108.863 kg (240 lb) 107.548 kg (237 lb 1.6 oz)    Intake/Output: I/O last 3 completed shifts: In: 1320 [P.O.:840; I.V.:480] Out: 2125 [Urine:2125]   Intake/Output this shift:  Total I/O In: -  Out: 250 [Urine:250]  Physical Exam: General: NAD, disheveled  Head: Normocephalic, atraumatic. Moist oral mucosal membranes  Eyes: Anicteric  Neck: Supple, trachea midline  Lungs:  Clear to auscultation normal effort  Heart: irregular  Abdomen:  Soft, nontender, BS present  Extremities:  trace peripheral edema.  Neurologic: Nonfocal, moving all four extremities, oriented to self/place  Skin: No lesions       Basic Metabolic Panel:  Recent Labs Lab 12/21/15 0228 12/21/15 1015  12/21/15 1431 12/22/15 0643 12/23/15 0543 12/24/15 0428 12/25/15 0440  NA 136  --   --   --  134* 130* 132* 131*  K 5.3*  --   < > 5.5* 5.2* 4.7 5.0 4.6  CL 106  --   --   --  100* 98* 98* 95*  CO2 18*  --   --   --  23 19* 23 26  GLUCOSE 69  --   --   --  86 326* 346* 349*  BUN 132*  --   --   --  100* 114* 107* 138*  CREATININE 7.20*  --   --   --  5.40* 4.73* 4.48* 4.19*  CALCIUM 8.3*  --   --   --  8.3* 8.1* 8.5* 8.3*  MG  --  2.0  --   --   --   --   --   --   < > = values in this interval not displayed.  Liver Function Tests: No results for input(s): AST, ALT, ALKPHOS, BILITOT, PROT, ALBUMIN in the last 168  hours. No results for input(s): LIPASE, AMYLASE in the last 168 hours. No results for input(s): AMMONIA in the last 168 hours.  CBC:  Recent Labs Lab 01/07/2016 1847 12/21/15 0228 12/22/15 0643 12/23/15 0543  WBC 10.3 9.4 12.7* 9.9  NEUTROABS 8.5*  --   --   --   HGB 12.6* 11.5* 12.2* 11.5*  HCT 37.7* 34.7* 36.3* 33.8*  MCV 92.0 92.1 91.7 91.5  PLT 156 146* 151 128*    Cardiac Enzymes:  Recent Labs Lab 01/09/2016 1847 12/21/15 0228 12/21/15 0835 12/21/15 1431  TROPONINI 0.21* 0.25* 0.23* 0.16*    BNP: Invalid input(s): POCBNP  CBG:  Recent Labs Lab 12/24/15 1120 12/24/15 1600 12/24/15 2112 12/25/15 0731 12/25/15 1115  GLUCAP 414* 409* 331* 295* 255*    Microbiology: Results for orders placed or performed during the hospital encounter of 12/22/2015  MRSA PCR Screening     Status: None   Collection Time: 12/21/15  5:51 PM  Result Value Ref Range Status   MRSA by PCR NEGATIVE NEGATIVE Final    Comment:  The GeneXpert MRSA Assay (FDA approved for NASAL specimens only), is one component of a comprehensive MRSA colonization surveillance program. It is not intended to diagnose MRSA infection nor to guide or monitor treatment for MRSA infections.     Coagulation Studies: No results for input(s): LABPROT, INR in the last 72 hours.  Urinalysis: No results for input(s): COLORURINE, LABSPEC, PHURINE, GLUCOSEU, HGBUR, BILIRUBINUR, KETONESUR, PROTEINUR, UROBILINOGEN, NITRITE, LEUKOCYTESUR in the last 72 hours.  Invalid input(s): APPERANCEUR    Imaging: No results found.   Medications:   .  sodium bicarbonate  infusion 1000 mL 40 mL/hr at 12/24/15 1656   . allopurinol  150 mg Oral Daily  . aspirin EC  81 mg Oral Daily  . atorvastatin  40 mg Oral q1800  . diltiazem  60 mg Oral 4 times per day  . heparin  5,000 Units Subcutaneous 3 times per day  . insulin aspart  0-15 Units Subcutaneous TID WC  . insulin aspart  0-5 Units Subcutaneous QHS  .  insulin glargine  30 Units Subcutaneous QHS  . pantoprazole  40 mg Oral BID AC  . predniSONE  50 mg Oral Daily  . sodium chloride flush  3 mL Intravenous Q12H   acetaminophen **OR** acetaminophen, dextrose, metoprolol, ondansetron **OR** ondansetron (ZOFRAN) IV  Assessment/ Plan:  82 y.o. male with a PMHx of chronic atrial fibrillation, diabetes mellitus type 2, gout, hypertension, chronic kidney disease stage IV followed by [facility], who was admitted to [facility] on 12/29/2015 for evaluation of mental status in the setting of hypoglycemia.  1. Acute renal failure/chronic kidney disease stage IV baseline EGFR 19-22 followed by Dr. Posey Pronto at [facility]. Acute renal failure likely related to poor by mouth intake that was prolonged at home.  Atrophic left kidney on [place]. Negative spep, abnormal UPEP.  -BUN continues to fluctuate and is currently up to 138. However creatinine continues to steadily trending down. Currently creatinine is 4.1. Urine output was 1.5 L over the preceding 24 hours. Therefore we remain hesitant to start the patient on dialysis however if azotemia persists we may need to consider this early next week..  2. Hyperkalemia.Potassium currently down to 4.6..  3. Metabolic acidosis. Serum bicarbonate up to 26. Therefore we will stop sodium bicarbonate drip and switch back to 0.9 normal saline at 40cc/hr.  4. Anemia chronic kidney disease. Last hemoglobin was 11.5. No indication for Procrit at the moment.  5.  Abnormal UPEP/MGUS:  Appreciate input from hematology. Further testing has been ordered including free light chains and immunofixation.   LOS: 5 Miguel Guerrero 2/12/201711:31 AM

## 2015-12-25 NOTE — Progress Notes (Signed)
Subjective: Patient notes left elbow symptoms much improved this morning.   Objective: Vital signs in last 24 hours: Temp:  [97.8 F (36.6 C)-98.6 F (37 C)] 97.8 F (36.6 C) (02/12 1117) Pulse Rate:  [75-121] 121 (02/12 1117) Resp:  [18-20] 18 (02/12 1117) BP: (117-136)/(52-61) 125/53 mmHg (02/12 1117) SpO2:  [94 %-97 %] 97 % (02/12 1117)  Intake/Output from previous day: 02/11 0701 - 02/12 0700 In: 1320 [P.O.:840; I.V.:480] Out: 1525 [Urine:1525] Intake/Output this shift: Total I/O In: -  Out: 250 [Urine:250]   Recent Labs  12/23/15 0543  HGB 11.5*    Recent Labs  12/23/15 0543  WBC 9.9  RBC 3.69*  HCT 33.8*  PLT 128*    Recent Labs  12/24/15 0428 12/25/15 0440  NA 132* 131*  K 5.0 4.6  CL 98* 95*  CO2 23 26  BUN 107* 138*  CREATININE 4.48* 4.19*  GLUCOSE 346* 349*  CALCIUM 8.5* 8.3*   No results for input(s): LABPT, INR in the last 72 hours.  Physical Exam: Minimal residual tenderness posteriorly. No erythema or effusion. Swelling improved. Pain free elbow ROM both actively and passively. NV intact to left UE.  Assessment: Posterior left elbow pain secondary to sterile olecranon bursitis vs. Gout, improving symptomatically.  Plan: Overall, the patient's left elbow symptoms appear to be improving. Continue present treatment. Patient may gradually resume normal daily activities as symptoms permit. I will sign off now. Please let me know if I can be of any further assistance. Thanks!   Miguel Guerrero Miguel Guerrero 12/25/2015, 12:12 PM

## 2015-12-26 ENCOUNTER — Encounter: Payer: Self-pay | Admitting: Physician Assistant

## 2015-12-26 DIAGNOSIS — K922 Gastrointestinal hemorrhage, unspecified: Secondary | ICD-10-CM

## 2015-12-26 DIAGNOSIS — N179 Acute kidney failure, unspecified: Secondary | ICD-10-CM | POA: Insufficient documentation

## 2015-12-26 DIAGNOSIS — I10 Essential (primary) hypertension: Secondary | ICD-10-CM

## 2015-12-26 DIAGNOSIS — N19 Unspecified kidney failure: Secondary | ICD-10-CM

## 2015-12-26 LAB — GLUCOSE, CAPILLARY
GLUCOSE-CAPILLARY: 292 mg/dL — AB (ref 65–99)
GLUCOSE-CAPILLARY: 327 mg/dL — AB (ref 65–99)
GLUCOSE-CAPILLARY: 312 mg/dL — AB (ref 65–99)
GLUCOSE-CAPILLARY: 310 mg/dL — AB (ref 65–99)
Glucose-Capillary: 313 mg/dL — ABNORMAL HIGH (ref 65–99)

## 2015-12-26 LAB — CBC
HCT: 16.9 % — ABNORMAL LOW (ref 40.0–52.0)
HEMATOCRIT: 17.3 % — AB (ref 40.0–52.0)
HEMOGLOBIN: 5.8 g/dL — AB (ref 13.0–18.0)
Hemoglobin: 5.5 g/dL — ABNORMAL LOW (ref 13.0–18.0)
MCH: 30.8 pg (ref 26.0–34.0)
MCH: 30 pg (ref 26.0–34.0)
MCHC: 33.5 g/dL (ref 32.0–36.0)
MCHC: 32.5 g/dL (ref 32.0–36.0)
MCV: 91.8 fL (ref 80.0–100.0)
MCV: 92.4 fL (ref 80.0–100.0)
Platelets: 168 10*3/uL (ref 150–440)
Platelets: 182 10*3/uL (ref 150–440)
RBC: 1.89 MIL/uL — AB (ref 4.40–5.90)
RBC: 1.83 MIL/uL — ABNORMAL LOW (ref 4.40–5.90)
RDW: 14.9 % — ABNORMAL HIGH (ref 11.5–14.5)
RDW: 14.7 % — AB (ref 11.5–14.5)
WBC: 14.8 10*3/uL — ABNORMAL HIGH (ref 3.8–10.6)
WBC: 18.8 10*3/uL — ABNORMAL HIGH (ref 3.8–10.6)

## 2015-12-26 LAB — BASIC METABOLIC PANEL
Anion gap: 10 (ref 5–15)
BUN: 185 mg/dL — AB (ref 6–20)
CHLORIDE: 94 mmol/L — AB (ref 101–111)
CO2: 25 mmol/L (ref 22–32)
Calcium: 8 mg/dL — ABNORMAL LOW (ref 8.9–10.3)
Creatinine, Ser: 4.43 mg/dL — ABNORMAL HIGH (ref 0.61–1.24)
GFR calc Af Amer: 13 mL/min — ABNORMAL LOW (ref >60)
GFR calc non Af Amer: 11 mL/min — ABNORMAL LOW (ref >60)
GLUCOSE: 287 mg/dL — AB (ref 65–99)
POTASSIUM: 5.5 mmol/L — AB (ref 3.5–5.1)
Sodium: 129 mmol/L — ABNORMAL LOW (ref 135–145)

## 2015-12-26 LAB — URINALYSIS COMPLETE WITH MICROSCOPIC (ARMC ONLY)
BACTERIA UA: NONE SEEN
BILIRUBIN URINE: NEGATIVE
GLUCOSE, UA: NEGATIVE mg/dL
Hgb urine dipstick: NEGATIVE
KETONES UR: NEGATIVE mg/dL
LEUKOCYTES UA: NEGATIVE
Nitrite: NEGATIVE
PH: 5 (ref 5.0–8.0)
Protein, ur: NEGATIVE mg/dL
SQUAMOUS EPITHELIAL / LPF: NONE SEEN
Specific Gravity, Urine: 1.014 (ref 1.005–1.030)

## 2015-12-26 LAB — PROTIME-INR
INR: 1.37
PROTHROMBIN TIME: 17 s — AB (ref 11.4–15.0)

## 2015-12-26 LAB — IMMUNOFIXATION, URINE

## 2015-12-26 LAB — ABO/RH: ABO/RH(D): A POS

## 2015-12-26 LAB — KAPPA/LAMBDA LIGHT CHAINS
Kappa free light chain: 40.97 mg/L — ABNORMAL HIGH (ref 3.30–19.40)
Kappa, lambda light chain ratio: 1.38 (ref 0.26–1.65)
Lambda free light chains: 29.69 mg/L — ABNORMAL HIGH (ref 5.71–26.30)

## 2015-12-26 MED ORDER — NOREPINEPHRINE BITARTRATE 1 MG/ML IV SOLN
0.0000 ug/min | INTRAVENOUS | Status: DC
  Administered 2015-12-26: 5 ug/min via INTRAVENOUS
  Filled 2015-12-26: qty 4

## 2015-12-26 MED ORDER — SODIUM POLYSTYRENE SULFONATE 15 GM/60ML PO SUSP: 30.0000 g | Freq: Once | ORAL | Status: DC

## 2015-12-26 MED ORDER — MORPHINE BOLUS VIA INFUSION
1.0000 mg | INTRAVENOUS | Status: DC | PRN
  Filled 2015-12-26: qty 2

## 2015-12-26 MED ORDER — SODIUM CHLORIDE 0.9 % IV SOLN: INTRAVENOUS | Status: DC

## 2015-12-26 MED ORDER — EPOETIN ALFA 10000 UNIT/ML IJ SOLN
10000.0000 [IU] | Freq: Once | INTRAMUSCULAR | Status: DC
  Filled 2015-12-26 (×2): qty 1

## 2015-12-26 MED ORDER — SODIUM CHLORIDE 0.9 % IV SOLN
8.0000 mg/h | INTRAVENOUS | Status: DC
  Administered 2015-12-26 – 2015-12-27 (×3): 8 mg/h via INTRAVENOUS
  Filled 2015-12-26 (×2): qty 80

## 2015-12-26 MED ORDER — NOREPINEPHRINE BITARTRATE 1 MG/ML IV SOLN
0.0000 ug/min | INTRAVENOUS | Status: DC
  Administered 2015-12-26: 20 ug/min via INTRAVENOUS
  Filled 2015-12-26: qty 16

## 2015-12-26 MED ORDER — MIDAZOLAM HCL 2 MG/2ML IJ SOLN
1.0000 mg | Freq: Once | INTRAMUSCULAR | Status: AC
  Administered 2015-12-26: 1 mg via INTRAVENOUS
  Filled 2015-12-26: qty 2

## 2015-12-26 MED ORDER — SODIUM CHLORIDE 0.9 % IV SOLN: Freq: Once | INTRAVENOUS | Status: DC

## 2015-12-26 MED ORDER — PANTOPRAZOLE SODIUM 40 MG IV SOLR
40.0000 mg | Freq: Two times a day (BID) | INTRAVENOUS | Status: DC
  Administered 2015-12-26: 40 mg via INTRAVENOUS
  Filled 2015-12-26: qty 40

## 2015-12-26 MED ORDER — CHLORHEXIDINE GLUCONATE CLOTH 2 % EX PADS
6.0000 | MEDICATED_PAD | Freq: Once | CUTANEOUS | Status: AC
  Administered 2015-12-26: 6 via TOPICAL

## 2015-12-26 MED ORDER — TUBERCULIN PPD 5 UNIT/0.1ML ID SOLN
5.0000 [IU] | Freq: Once | INTRADERMAL | Status: DC
  Filled 2015-12-26: qty 0.1

## 2015-12-26 MED ORDER — MORPHINE 100MG IN NS 100ML (1MG/ML) PREMIX INFUSION: 10.0000 mg/h | INTRAVENOUS | Status: DC

## 2015-12-26 MED ORDER — MORPHINE SULFATE (PF) 2 MG/ML IV SOLN
1.0000 mg | INTRAVENOUS | Status: DC | PRN
  Administered 2015-12-26: 2 mg via INTRAVENOUS
  Filled 2015-12-26: qty 1

## 2015-12-26 MED ORDER — FENTANYL CITRATE (PF) 100 MCG/2ML IJ SOLN
50.0000 ug | Freq: Once | INTRAMUSCULAR | Status: AC
  Administered 2015-12-26: 50 ug via INTRAVENOUS
  Filled 2015-12-26: qty 2

## 2015-12-26 MED ORDER — SODIUM CHLORIDE 0.9 % IV SOLN
1.0000 g | Freq: Once | INTRAVENOUS | Status: AC
  Administered 2015-12-26: 1 g via INTRAVENOUS
  Filled 2015-12-26: qty 10

## 2015-12-26 MED ORDER — MORPHINE SULFATE 10 MG/ML IJ SOLN
INTRAMUSCULAR | Status: DC
  Administered 2015-12-26: 23:00:00 via INTRAVENOUS
  Filled 2015-12-26 (×3): qty 90

## 2015-12-26 MED ORDER — MAGNESIUM SULFATE 2 GM/50ML IV SOLN
2.0000 g | Freq: Once | INTRAVENOUS | Status: AC
  Administered 2015-12-26: 2 g via INTRAVENOUS
  Filled 2015-12-26: qty 50

## 2015-12-26 MED ORDER — DEXTROSE 50 % IV SOLN
25.0000 mL | Freq: Once | INTRAVENOUS | Status: AC
Start: 2015-12-26 — End: 2015-12-26
  Administered 2015-12-26: 25 mL via INTRAVENOUS
  Filled 2015-12-26: qty 50

## 2015-12-26 MED ORDER — INSULIN REGULAR HUMAN 100 UNIT/ML IJ SOLN
5.0000 [IU] | Freq: Once | INTRAMUSCULAR | Status: AC
  Administered 2015-12-26: 5 [IU] via INTRAVENOUS
  Filled 2015-12-26: qty 0.05

## 2015-12-26 MED ORDER — SODIUM CHLORIDE 0.9 % IV SOLN
1.0000 g | Freq: Once | INTRAVENOUS | Status: DC
  Filled 2015-12-26: qty 10

## 2015-12-26 MED ORDER — SODIUM BICARBONATE 8.4 % IV SOLN
INTRAVENOUS | Status: DC
  Administered 2015-12-26: 12:00:00 via INTRAVENOUS
  Filled 2015-12-26 (×2): qty 150

## 2015-12-26 NOTE — Progress Notes (Signed)
Nutrition Follow-up    INTERVENTION:   Coordination of Care: await diet progression  NUTRITION DIAGNOSIS:   Inadequate oral intake related to vomiting, poor appetite as evidenced by meal completion < 25%, per patient/family report. Continues as pt now NPO  GOAL:   Patient will meet greater than or equal to 90% of their needs  MONITOR:    (Energy Intake, Anthropometrics, Digestive System, Glucose Profile, Electrolyte/Renal Profile)  REASON FOR ASSESSMENT:   Consult Assessment of nutrition requirement/status  ASSESSMENT:    Pt critically ill, worsening renal function with plans for initiation of HD; pt now with acute blood loss anemia with Hgb 5.5, GI consult pending  Diet Order:   No order  Energy Intake: recorded po intake on 2/10-2/11 improved at 80-100% of meals, no recorded po intake since. Prior to this pt with poor po due to N/V, poor appetite  Skin:  Reviewed, no issues   Recent Labs Lab 12/21/15 1015  12/24/15 0428 12/25/15 0440 12/26/15 0550  NA  --   < > 132* 131* 129*  K  --   < > 5.0 4.6 5.5*  CL  --   < > 98* 95* 94*  CO2  --   < > 23 26 25   BUN  --   < > 107* 138* 185*  CREATININE  --   < > 4.48* 4.19* 4.43*  CALCIUM  --   < > 8.5* 8.3* 8.0*  MG 2.0  --   --   --   --   GLUCOSE  --   < > 346* 349* 287*  < > = values in this interval not displayed.  Glucose Profile:  Recent Labs  12/26/15 0722 12/26/15 1131 12/26/15 1324  GLUCAP 292* 327* 312*   Nutritional Anemia Profile:  CBC Latest Ref Rng 12/26/2015 12/26/2015 12/23/2015  WBC 3.8 - 10.6 K/uL 18.8(H) 14.8(H) 9.9  Hemoglobin 13.0 - 18.0 g/dL 5.5(L) 5.8(L) 11.5(L)  Hematocrit 40.0 - 52.0 % 16.9(L) 17.3(L) 33.8(L)  Platelets 150 - 440 K/uL 182 168 128(L)   Meds: D5-sodium bcarb at 50 ml/hr, prednisone, ss novolog, aspart with meals  Height:   Ht Readings from Last 1 Encounters:  12/15/2015 6\' 1"  (1.854 m)    Weight:   Wt Readings from Last 1 Encounters:  12/29/2015 237 lb 1.6 oz  (107.548 kg)    Filed Weights   12/19/2015 1841 12/21/2015 2300  Weight: 240 lb (108.863 kg) 237 lb 1.6 oz (107.548 kg)    BMI:  Body mass index is 31.29 kg/(m^2).  Estimated Nutritional Needs:   Kcal:  [id number] kcals (BEE 1594, 1.2 AF, 1.1-1.3 IF) using IBW 84 kg  Protein:  84-101 g (1.0-1.2 g/kg)   Fluid:  2100-2520 (25-30 ml/kg)   HIGH Care Level  Kerman Passey MS, RD, LDN [phone] Pager  [phone] Weekend/On-Call Pager

## 2015-12-26 NOTE — Clinical Social Work Note (Signed)
Clinical Social Work Assessment  Patient Details  Name: Miguel Guerrero MRN: [id number] Date of Birth: 01/11/1933  Date of referral:  12/26/15               Reason for consult:  Discharge Planning                Permission sought to share information with:  Family Supports Permission granted to share information::  Yes, Verbal Permission Granted  Name::        Agency::     Relationship::   Lavon Paganini (Son) [phone] and Zackery Barefoot (Daughter) [phone])  Contact Information:     Housing/Transportation Living arrangements for the past 2 months:  [place] of Information:  Adult Children, Patient Patient Interpreter Needed:  None Criminal Activity/Legal Involvement Pertinent to Current Situation/Hospitalization:  No - Comment as needed Significant Relationships:  Adult Children, Spouse Lives with:  Spouse Do you feel safe going back to the place where you live?  Yes Need for family participation in patient care:  Yes (Comment) Lavon Paganini (Son) [phone] and Zackery Barefoot (Daughter) [phone])  Care giving concerns:  Patient is recommended for SNF placement.    Social Worker assessment / plan:  CSW was informed that ptient is recommended for SNF placement. CSW met with patient and his son Miguel Guerrero at bedside. Per Miguel Hurst patient will need SNF placement. He reports that his mother is at [facility] for SNF. He reports that he would like for patient to got to [facility] if a bed is available. Verbal permission to send [facility] for placement. CSW was informed that patient will need dialysis. Maudie Mercury- dialysis liaison was notified by RN Case Manager.    FL2/ PASRR completed and faxed to SNF's in [facility]. Awaiting bed offers.   Patient was transferred to [facility]. CSW informed IC CSW. She is aware of above. CSW will continue to follow and assist.   Employment status:  Retired Forensic scientist:  Medicare PT Recommendations:  [place] / Referral to community resources:  [place]  Patient/Family's Response to care:  Patient and his family are agreeable to SNF placement at discharge.   Patient/Family's Understanding of and Emotional Response to Diagnosis, Current Treatment, and Prognosis:  Patient and his son understand's CSW's role and is appreciative to CSW's assistance.   Emotional Assessment Appearance:  Appears stated age Attitude/Demeanor/Rapport:   (None ) Affect (typically observed):  Accepting, Calm, Pleasant Orientation:  Oriented to Self Alcohol / Substance use:  Not Applicable Psych involvement (Current and /or in the community):  No (Comment)  Discharge Needs  Concerns to be addressed:  Discharge Planning Concerns Readmission within the last 30 days:  No Current discharge risk:  Chronically ill Barriers to Discharge:  Continued Medical Work up   [employer], LCSW 12/26/2015, 2:10 PM

## 2015-12-26 NOTE — Progress Notes (Signed)
Inpatient Diabetes Program Recommendations  AACE/ADA: New Consensus Statement on Inpatient Glycemic Control (2015)  Target Ranges:  Prepandial:   less than 140 mg/dL      Peak postprandial:   less than 180 mg/dL (1-2 hours)      Critically ill patients:  140 - 180 mg/dL  Results for Miguel Guerrero, Miguel Guerrero (MRN [id number]) as of 12/26/2015 08:36  Ref. Range 12/25/2015 07:31 12/25/2015 11:15 12/25/2015 17:00 12/25/2015 21:31 12/26/2015 07:22  Glucose-Capillary Latest Ref Range: 65-99 mg/dL 295 (H)  Novolog 8 units (SSI) 255 (H)  Novolog 8 units (SSI) 250 (H)  Novolog 12 units (SSI + MC) 289 (H)  Novolog 3 units (SSI) 292 (H)  Novolin R 5 units   Review of Glycemic Control  Diabetes history: DM2 Outpatient Diabetes medications: Lantus 50 units daily, Actos 45 mg daily, Amaryl 6 mg QAM Current orders for Inpatient glycemic control: Lantus 45 units QHS, Novolog 0-15 units TID with meals, Novolog 0-5 units QHS, Novolog 7 units TID with meals for meal coverage  Inpatient Diabetes Program Recommendations: Insulin - Basal: Please consider increasing Lantus to 54 units QHS (based on 107 kg x 0.5 units).   NOTE: Patient is ordered Prednisone 50 mg daily which is contributing to hyperglycemia. Patient only received meal coverage once on 12/25/15 with supper since he only took a few bites of breakfast and lunch yesterday. Noted patient received Novolin R 5 units this morning for hyperkalemia treatment along with D50 25 ml.    Thanks, Barnie Alderman, RN, MSN, CDE Diabetes Coordinator Inpatient Diabetes Program [phone] (Team Pager from [place] to [place]) [phone] ([place] office) [phone] [facility] office) [phone] [facility] office)

## 2015-12-26 NOTE — Progress Notes (Addendum)
Patient accepted from 2A.  Patient pale and hypotensive upon arrival.  Patient withdrawn but follows simple commands.  Assessment complete, see flow sheet. Dr. Marthann Schiller paged.

## 2015-12-26 NOTE — Progress Notes (Addendum)
eLink Physician-Brief Progress Note Patient Name: Miguel Guerrero DOB: May 19, 1933 MRN: [id number]   Date of Service  12/26/2015  HPI/Events of Note    eICU Interventions  EOL issues & poor prognosis- d/w daughter Cap levo @ current dose moprhine prn   Addendum - comfort care orders placed  Intervention Category Major Interventions: End of life / care limitation discussion  Ariahna Smiddy V. 12/26/2015, 10:13 PM

## 2015-12-26 NOTE — Progress Notes (Signed)
New order from Dr. Marcille Blanco to place patient on Zoll pads and give Magnesium 2 gm IV PB and Calcium gluconate 1 gram x 1 dose. Zoll pads placed. Patient is asymptomatic. Will continue to monitor.

## 2015-12-26 NOTE — Procedures (Addendum)
  Procedure Note: Right Femoral VasCath Placement.  Miguel Guerrero , [id number] , IC17A/IC17A-AA  Indications: Dialysis Benefits, risks (including bleeding, infection,  Injury, etc.), and alternatives explained to patient and son who voiced understanding.  Questions were sought and answered.   Patient and son agreed to proceed with the procedure.  Consent is signed and on chart. A time-out was completed verifying correct patient, procedure and site.  A 2 lumen catheter available at the time of procedure.  The patient was placed in a dependent position appropriate for central line placement based on the vein to be cannulated.   The patient's RIGHT Femoral Vein was prepped and draped in a sterile fashion.  1% Lidocaine WAS  used to anesthetize the surrounding skin area.   A 2 lumen catheter was introduced into the RIGHT Femoral Vein using Seldinger technique, visualized under ultrasound.  The catheter was threaded smoothly over the guide wire and appropriate blood return was obtained.  Each lumen of the catheter was evacuated of air and flushed with sterile saline.  The catheter was then sutured in place to the skin and a sterile dressing applied.  Perfusion to the extremity distal to the point of catheter insertion was checked and found to be adequate.     The patient tolerated the procedure well and there were no complications.  Vilinda Boehringer, MD [facility] Pager [phone] (please enter 7-digits) On Call Pager - [phone] (please enter 7-digits)

## 2015-12-26 NOTE — Care Management (Signed)
Patient transferred to stepdown for closer monitoring as will be intiiating dialysis, and patient's hgb is 5.8 in patient that declines blood transfusion due to religous reasons

## 2015-12-26 NOTE — Progress Notes (Signed)
Seen at 10;30 am, Some drowsiness, and dementia, but arousable.  Hb dropped significantly after having a black coloured vomit and dark stool.  Gradually BP is droping.  Assessment and plan  * Hypovolemic shock- due to GI blood loss.     Pt is Fara Boros witness- no Blood transfusion per his son.     Transfer to ICU, levophed drip, IV protonix.    GI and Palliative care consult.  Rising WBCs, No fever.   Check UA.  Pt is full code.  I discussed with his son and his daughter about findings, and critical condition. Discussed with cardiology, Nephrology and GI.  Critical care time spent 50 min.

## 2015-12-26 NOTE — Progress Notes (Signed)
[facility]  ROUNDING NOTE   Subjective:   Son and family at bedside.  BUN 185.  Coffee ground emesis.   hgb 5.8 - Jehovah witness   Objective:  Vital signs in last 24 hours:  Temp:  [97.3 F (36.3 C)-98.1 F (36.7 C)] 97.3 F (36.3 C) (02/13 0522) Pulse Rate:  [107-121] 110 (02/13 0522) Resp:  [18-20] 18 (02/13 0522) BP: (113-132)/(48-54) 113/48 mmHg (02/13 0522) SpO2:  [97 %-98 %] 97 % (02/13 0522)  Weight change:  Filed Weights   12/15/2015 1841 12/19/2015 2300  Weight: 108.863 kg (240 lb) 107.548 kg (237 lb 1.6 oz)    Intake/Output: I/O last 3 completed shifts: In: 0017 [P.O.:480; I.V.:896] Out: 2126 [Urine:2126]   Intake/Output this shift:  Total I/O In: -  Out: 300 [Emesis/NG output:300]  Physical Exam: General: disheveled  Head: Normocephalic, atraumatic. Moist oral mucosal membranes  Eyes: Anicteric  Neck: Supple, trachea midline  Lungs:  Clear to auscultation normal effort  Heart: irregular  Abdomen:  Soft, nontender, BS present  Extremities: trace peripheral edema.  Neurologic: Nonverbal, confused  Skin: No lesions  Access:  none    Basic Metabolic Panel:  Recent Labs Lab 12/21/15 1015  12/22/15 0643 12/23/15 0543 12/24/15 0428 12/25/15 0440 12/26/15 0550  NA  --   --  134* 130* 132* 131* 129*  K  --   < > 5.2* 4.7 5.0 4.6 5.5*  CL  --   --  100* 98* 98* 95* 94*  CO2  --   --  23 19* 23 26 25   GLUCOSE  --   --  86 326* 346* 349* 287*  BUN  --   --  100* 114* 107* 138* 185*  CREATININE  --   --  5.40* 4.73* 4.48* 4.19* 4.43*  CALCIUM  --   --  8.3* 8.1* 8.5* 8.3* 8.0*  MG 2.0  --   --   --   --   --   --   < > = values in this interval not displayed.  Liver Function Tests: No results for input(s): AST, ALT, ALKPHOS, BILITOT, PROT, ALBUMIN in the last 168 hours. No results for input(s): LIPASE, AMYLASE in the last 168 hours. No results for input(s): AMMONIA in the last 168 hours.  CBC:  Recent Labs Lab 12/21/2015 1847  12/21/15 0228 12/22/15 0643 12/23/15 0543 12/26/15 0550 12/26/15 0957  WBC 10.3 9.4 12.7* 9.9 14.8* 18.8*  NEUTROABS 8.5*  --   --   --   --   --   HGB 12.6* 11.5* 12.2* 11.5* 5.8* 5.5*  HCT 37.7* 34.7* 36.3* 33.8* 17.3* 16.9*  MCV 92.0 92.1 91.7 91.5 91.8 92.4  PLT 156 146* 151 128* 168 182    Cardiac Enzymes:  Recent Labs Lab 01/06/2016 1847 12/21/15 0228 12/21/15 0835 12/21/15 1431  TROPONINI 0.21* 0.25* 0.23* 0.16*    BNP: Invalid input(s): POCBNP  CBG:  Recent Labs Lab 12/25/15 0731 12/25/15 1115 12/25/15 1700 12/25/15 2131 12/26/15 0722  GLUCAP 295* 255* 250* 58* 20*    Microbiology: Results for orders placed or performed during the hospital encounter of 01/06/2016  MRSA PCR Screening     Status: None   Collection Time: 12/21/15  5:51 PM  Result Value Ref Range Status   MRSA by PCR NEGATIVE NEGATIVE Final    Comment:        The GeneXpert MRSA Assay (FDA approved for NASAL specimens only), is one component of a comprehensive MRSA colonization  surveillance program. It is not intended to diagnose MRSA infection nor to guide or monitor treatment for MRSA infections.     Coagulation Studies: No results for input(s): LABPROT, INR in the last 72 hours.  Urinalysis: No results for input(s): COLORURINE, LABSPEC, PHURINE, GLUCOSEU, HGBUR, BILIRUBINUR, KETONESUR, PROTEINUR, UROBILINOGEN, NITRITE, LEUKOCYTESUR in the last 72 hours.  Invalid input(s): APPERANCEUR    Imaging: No results found.   Medications:   . sodium chloride 40 mL/hr at 12/25/15 1148   . sodium chloride   Intravenous Once  . allopurinol  150 mg Oral Daily  . atorvastatin  40 mg Oral q1800  . calcium gluconate  1 g Intravenous Once  . diltiazem  60 mg Oral 4 times per day  . insulin aspart  0-15 Units Subcutaneous TID WC  . insulin aspart  0-5 Units Subcutaneous QHS  . insulin aspart  7 Units Subcutaneous TID WC  . insulin glargine  45 Units Subcutaneous QHS  . pantoprazole  (PROTONIX) IV  40 mg Intravenous Q12H  . predniSONE  50 mg Oral Daily  . sodium chloride flush  3 mL Intravenous Q12H   acetaminophen **OR** acetaminophen, dextrose, metoprolol, ondansetron **OR** ondansetron (ZOFRAN) IV  Assessment/ Plan:  82 y.o. male  Miguel Guerrero is a 82 y.o. white male with chronic atrial fibrillation, diabetes mellitus type 2, gout, hypertension, chronic kidney disease stage IV followed by [facility], who was admitted to [facility] on 12/30/2015 for evaluation of mental status in the setting of hypoglycemia.  1. Acute renal failure on chronic kidney disease stage IV: with hyperkalemia, hyponatremia, and metabolic acidosis baseline EGFR 19-22 followed by Dr. Posey Pronto at [facility]. Acute renal failure with prerenal azotemia and possible GI bleed.  M-spike from 2/8 was 15.8%.  - Check free light chains for kappa to lambda ratio. UPEP pending.  - Patient will need dialysis today. Unclear with patient will have recovery or be labeled ESRD at this time - Consult vascular surgery for catheter placement.  - nonoliguric urine output.  - Discontinue IV NS and restart bicarb gtt for metabolic acidosis.   2. Acute anemia on anemia of chronic kidney failure: hemoglobin on repeat of 5.5. Jehovah witness who does not receive blood products - Order erythropoietin with HD treatment - Consult GI for possible bleed.  - protonix.   3. Secondary Hyperaparathyroidism: PTH 119. Hypocalcemia.  - No indication for activated vitamin D   4. Hypertension: blood pressure at goal. Holding home blood pressure agents.    LOS: 6 Pam Vanalstine 2/13/201710:44 AM

## 2015-12-26 NOTE — Consult Note (Signed)
Palliative Medicine Inpatient Consult Note   Name: Miguel Guerrero Date: 12/26/2015 MRN: [id number]  DOB: 1933/08/11  Referring Physician: Vaughan Basta, MD  Palliative Care consult requested for this 82 y.o. male for goals of medical therapy in patient with acute renal failure..   TODAY'S DISCUSSIONS AND DECISIONS: Family spoke with Dr Abigail Butts, Nephrologist, and with Dr Stevenson Clinch, [place].  They have opted to pursue a short course of dialysis to see if this will help pt.      BRIEF HISTORY: Pt was found by a son this morning on the floor beside his bed. He had been incontinent of stool and was acting confused so son called EMS and they found pts glucose to be in the 40's.  He was treated for this and stayed at home. Later that day, the other son and a daughter found pt to be unresponsive.  EMS again found his glucose to be low and he was treated and transported to the ED at [facility] where he was then admitted on Feb 8th.  Family reports that recently pt has had very poor oral intake.  Despite this, his glucose was running high and his oral diabetic meds were increased.  Pt had complained of being very cold recently and also of not having any energy.  He had been sleeping all the time.  He had a mildly elevated troponin and a very high creatinine (in the setting of KNOWN stage IV chronic kidney disease).    On Feb 9th, his blood pressure was running low.  He had some improvement in renal function and as such, did not need dialysis at this time.  He had some loose stools on the night of Feb 9th.  He also had some recurrent hypoglycemia. Then there were two episodes of vomiting of dark fluid and then melenic stool was noted.    Coumadin was not started because of this.  He had some left elbow pain and an xray was done (pt is known to have gout as a chronic condition--prednisone was started for this on Feb 10th).  He was noted to have an abnormal UPAP /MGUS and  hematology was consulted.  An echo showed normal LV function.   Pt has been eating less than 25 % while here. On Feb 10th, he seemed to feel better.  He was continued on SQ heparin as DVT prophylaxis.  Pt was noted to be confused about a number of matters by case manager --and it is thus known that he is not safe to be or live alone any longer. His creatinine continued to improve gradually.   Dr Rudean Hitt, Hematologist-oncologist, saw pt and felt that there is concern for plasma cell dyscrasia.  Urine Protein immunofixation is ordered.  SPEP showed no evidence of monoclonal protein, but serum free light cahain assay is to be done. Pt then had some hyperglycemia treated with insulin.   On Feb 13th, pt had some runs of nonstustained V Tach.  Magnesium IV was given as well as Calcium Gluconate. V Tach became sustained. Potassium was 5.5 .  Pt developed coffee-ground emesis. Hemoglobin was 11.5 on Feb 10th and then when rechecked on Feb 13th was down to 5.8.  Pt is jehovah's witness and cannot receive blood products but can get EPO.  Dialysis was discussed and family opted to start this.  A GI consult was obtained and pt is too ill for endoscopy.  (Dr Gustavo Lah).  Pt was changed from Full Code to DNR status on  Feb 13th per Dr Merian Capron note.   A Palliative Care Consult was requested.    -----------------------------------  IMPRESSION: Persistent (chronic) A Fib V tach GI bleeding --probably upper GI bleed with very low Hgb and shock Acute Blood Loss Anemia due to GI bleed ---cannot be transfused  DM2 Gout HTN Dyslipidemia Chronic Stage IV kidney failure --probably transitioned to Stage V (ESRD) ---BVUN was 132 and Cr was 7.2 on arrival.   Hypoglycemia Bilateral Hearing deficits (wears hearing aides) Mild Thrombocytopenia Abnormal Urine Protein Electrophoresis  Elevated Troponin--due to demand ischemia and in context of renal failure ---treated here initially with heparin and then stopped.  He  had had Warfarin stopped for AFIB in the past.  Malnutrition and loss of appetite Hyperkalemia due to renal failure (treated) Metabolic Acidosis Anemia of chronic disease (Hgb was 12.2 at arrival so procrit was not Rxd) Probably Early Dementia (pt confused)    REVIEW OF SYSTEMS:  Patient is not able to provide ROS due to critical illness  SPIRITUAL SUPPORT SYSTEM: Yes.  SOCIAL HISTORY:  reports that he quit smoking about 47 years ago. He has never used smokeless tobacco. He reports that he does not drink alcohol or use illicit drugs.  Pts wife has dementia and was recently placed in a rehab facility following a leg fracture.  ([facility]).  Pt stayed with her for five days and after that, he declined.  Other family members have talked with nephrology and decided to pursue a short course of dialysis to see if this would help.  Pt is JEHOVAH's WITNESS and therefore there are to be no blood products transfused.   LEGAL DOCUMENTS:  none  CODE STATUS: DNR  PAST MEDICAL HISTORY: Past Medical History  Diagnosis Date  . Chronic atrial fibrillation (HCC)     a. CHA2DS2-VASc Score is 4 (HTN, DM, Age (2)) On Coumadin in the past, not currently.  . Type 2 diabetes mellitus ([place])   . Gout   . HTN (hypertension)   . HLD (hyperlipidemia)   . ESRD (end stage renal disease) ([place])   . Patient is Sales promotion account executive Witness     a. per patient's son patient cannot receive blood products    PAST SURGICAL HISTORY:  Past Surgical History  Procedure Laterality Date  . Cholecystectomy      ALLERGIES:  has No Known Allergies.  MEDICATIONS:  Current Facility-Administered Medications  Medication Dose Route Frequency Provider Last Rate Last Dose  . acetaminophen (TYLENOL) tablet 650 mg  650 mg Oral Q6H PRN Lance Coon, MD       Or  . acetaminophen (TYLENOL) suppository 650 mg  650 mg Rectal Q6H PRN Lance Coon, MD      . allopurinol (ZYLOPRIM) tablet 150 mg  150 mg Oral Daily Gladstone Lighter, MD   150  mg at 12/25/15 [id number]  . atorvastatin (LIPITOR) tablet 40 mg  40 mg Oral q1800 Gladstone Lighter, MD   40 mg at 12/25/15 1707  . calcium gluconate 1 g in sodium chloride 0.9 % 100 mL IVPB  1 g Intravenous Once Harrie Foreman, MD   1 g at 12/26/15 0715  . dextrose 50 % solution 50 mL  1 ampule Intravenous PRN Lance Coon, MD   25 mL at 12/21/15 0956  . epoetin alfa (EPOGEN,PROCRIT) injection 10,000 Units  10,000 Units Intravenous Once Sarath Kolluru, MD      . insulin aspart (novoLOG) injection 0-15 Units  0-15 Units Subcutaneous TID WC Gladstone Lighter, MD   11  Units at 12/26/15 1708  . insulin aspart (novoLOG) injection 0-5 Units  0-5 Units Subcutaneous QHS Gladstone Lighter, MD   3 Units at 12/25/15 2154  . insulin aspart (novoLOG) injection 7 Units  7 Units Subcutaneous TID WC Gladstone Lighter, MD   7 Units at 12/25/15 1707  . metoprolol (LOPRESSOR) injection 5 mg  5 mg Intravenous Q6H PRN Gladstone Lighter, MD      . norepinephrine (LEVOPHED) 4 mg in dextrose 5 % 250 mL (0.016 mg/mL) infusion  0-40 mcg/min Intravenous Titrated Vaughan Basta, MD 37.5 mL/hr at 12/26/15 1700 10 mcg/min at 12/26/15 1700  . ondansetron (ZOFRAN) tablet 4 mg  4 mg Oral Q6H PRN Lance Coon, MD       Or  . ondansetron [facility]) injection 4 mg  4 mg Intravenous Q6H PRN Lance Coon, MD   4 mg at 12/26/15 1145  . pantoprazole (PROTONIX) 80 mg in sodium chloride 0.9 % 250 mL (0.32 mg/mL) infusion  8 mg/hr Intravenous Continuous Christiane Landis Martins, NP 25 mL/hr at 12/26/15 1600 8 mg/hr at 12/26/15 1600  . predniSONE (DELTASONE) tablet 50 mg  50 mg Oral Daily Gladstone Lighter, MD   50 mg at 12/25/15 [id number]  . sodium bicarbonate 150 mEq in dextrose 5 % 1,000 mL infusion   Intravenous Continuous Lavonia Dana, MD 50 mL/hr at 12/26/15 1300    . sodium chloride flush (NS) 0.9 % injection 3 mL  3 mL Intravenous Q12H Lance Coon, MD   3 mL at 12/25/15 2156  . tuberculin injection 5 Units  5 Units Intradermal Once  Lavonia Dana, MD   5 Units at 12/26/15 1334    Vital Signs: BP 93/61 mmHg  Pulse 114  Temp(Src) 97.9 F (36.6 C) (Oral)  Resp 18  Ht 6\' 1"  (1.854 m)  Wt 107.548 kg (237 lb 1.6 oz)  BMI 31.29 kg/m2  SpO2 100% Filed Weights   12/23/2015 1841 01/07/2016 2300  Weight: 108.863 kg (240 lb) 107.548 kg (237 lb 1.6 oz)    Estimated body mass index is 31.29 kg/(m^2) as calculated from the following:   Height as of this encounter: 6\' 1"  (1.854 m).   Weight as of this encounter: 107.548 kg (237 lb 1.6 oz).  PERFORMANCE STATUS (ECOG) : 4 - Bedbound currently  PHYSICAL EXAM:  NAD Sleeping Eyes closed No jvd or tm Skin w/o mottling or cyanosis      LABS: CBC:    Component Value Date/Time   WBC 18.8* 12/26/2015 0957   HGB 5.5* 12/26/2015 0957   HCT 16.9* 12/26/2015 0957   PLT 182 12/26/2015 0957   MCV 92.4 12/26/2015 0957   NEUTROABS 8.5* 12/18/2015 1847   LYMPHSABS 0.7* 12/25/2015 1847   MONOABS 1.0 12/19/2015 1847   EOSABS 0.1 01/06/2016 1847   BASOSABS 0.0 12/17/2015 1847   Comprehensive Metabolic Panel:    Component Value Date/Time   NA 129* 12/26/2015 0550   K 5.5* 12/26/2015 0550   CL 94* 12/26/2015 0550   CO2 25 12/26/2015 0550   BUN 185* 12/26/2015 0550   CREATININE 4.43* 12/26/2015 0550   GLUCOSE 287* 12/26/2015 0550   CALCIUM 8.0* 12/26/2015 0550   TESTS  Echo 12/21/15: - Left ventricle: The cavity size was normal. There was mild concentric hypertrophy. Systolic function was vigorous. The estimated ejection fraction was in the range of 65% to 70%. Wall motion was normal; there were no regional wall motion abnormalities. - Aortic valve: moderately calcified leaflets. Transvalvular velocity was increased. There was very  mild stenosis. Peak velocity (S): 234 cm/s. Peak gradient (S): 22 mm Hg. - Left atrium: The atrium was mildly dilated. - Right ventricle: Systolic function was normal. - Pulmonary arteries: Systolic pressure was mild to  moderately elevated. PA peak pressure: 49 mm Hg (S).  12/21/15: CXR 2 view: Cardiomegaly with mild vascular congestion. RIGHT pleural effusion. No overt failure or infiltrates.  CT abd/ pelvis 12/22/15: No evidence of small-bowel obstruction or significant inflammatory changes. Non specific mesenteric stranding. Left renal atrophy and bilateral perinephric stranding, consistent with the given history of chronic renal failure. Otherwise, no evidence of significant abnormalities of the solid abdominal organs, accounting for lack of IV contrast. Focal duodenal prominence likely represents 2 duodenal diverticula. Poorly evaluated peribronchovascular and subpleural interstitial thickening and reticulation in the lower lobes of the lungs. This may represent an element of chronic interstitial lung disease.  Renal US 12/22/15: Parenchymal changes and atrophy consistent with medical renal  disease on the left. The renal size and echotexture on the right  appears normal. However, vascular flow appears diminished  bilaterally.  Moderate-sized postvoid residual urinary bladder volume.  Xray 12/22/15: 1. Small elbow joint effusion with minimal visibility of the posterior fat pad along the bony margin. 2. Effaced supinator fat pad. This might be due to the underlying generalized edema in the subcutaneous tissues and within the region, but this can sometimes be associated with occult radial head fracture. 3. Soft tissue swelling overlying the olecranon, although this may be part of the generalized subcutaneous edema rather than necessarily indicating olecranon bursitis.    More than 50% of the visit was spent in counseling/coordination of care: Yes  Time Spent: 55 minutes

## 2015-12-26 NOTE — Progress Notes (Signed)
Patient  Is having episodes of V- tach 9 earlier and 7 now but sporadic. Patient is asymptomatic. Dr. Jannifer Franklin notified but no new order received. Patient is resting  quietly with his eyes closed, respirations even and unlabored.

## 2015-12-26 NOTE — Clinical Social Work Placement (Signed)
   CLINICAL SOCIAL WORK PLACEMENT  NOTE  Date:  12/26/2015  Patient Details  Name: Miguel Guerrero MRN: [id number] Date of Birth: 1933-04-17  Clinical Social Work is seeking post-discharge placement for this patient at the [place] level of care (*CSW will initial, date and re-position this form in  chart as items are completed):  No   Patient/family provided with [facility] Work Department's list of facilities offering this level of care within the geographic area requested by the patient (or if unable, by the patient's family).  Yes   Patient/family informed of their freedom to choose among providers that offer the needed level of care, that participate in Medicare, Medicaid or managed care program needed by the patient, have an available bed and are willing to accept the patient.  Yes   Patient/family informed of [facility]'s ownership interest in [facility] and [facility], as well as of the fact that they are under no obligation to receive care at these facilities.  PASRR submitted to EDS on 12/26/15     PASRR number received on 12/26/15     Existing PASRR number confirmed on       FL2 transmitted to all facilities in geographic area requested by pt/family on 12/26/15     FL2 transmitted to all facilities within larger geographic area on       Patient informed that his/her managed care company has contracts with or will negotiate with certain facilities, including the following:            Patient/family informed of bed offers received.  Patient chooses bed at       Physician recommends and patient chooses bed at      Patient to be transferred to   on  .  Patient to be transferred to facility by       Patient family notified on   of transfer.  Name of family member notified:        PHYSICIAN       Additional Comment:    _______________________________________________ Baldemar Lenis, LCSW 12/26/2015, 2:17 PM

## 2015-12-26 NOTE — Progress Notes (Signed)
Non-sustaining V-tach but frequent, Dr. Jannifer Franklin notified with a new order for EKG sat. Patient is asymptomatic. Will continue to monitor.

## 2015-12-26 NOTE — H&P (Signed)
PULMONARY / CRITICAL CARE MEDICINE   Name: Miguel Guerrero MRN: [id number] DOB: [birth year]    ADMISSION DATE:  01/05/2016 CONSULTATION DATE:  12/26/15  REFERRING MD :  Dr. Anselm Jungling   CHIEF COMPLAINT:     Hypotension and GI Bleed   HISTORY OF PRESENT ILLNESS    issue is a pleasant 82 year old male, Jehovah witness, past medical history of chronic atrial fibrillation, diabetes, gout, hypertension, hyperlipidemia, found confused on the floor bedside on 12/17/2015, and was subsequently admitted to the hospital.  He was found to be hypoglycemic and subsequently admitted, since his admission is been treated for hypoglycemia, acute chronic renal failure, nausea vomiting, uremia,hyperkalemia,confusion. Overnight he was noted to have episodes of V. Tach,along with early-morning episode of melena hemoptysis, his hemoglobin dropped from 11.5-5.8 this morning. GI was consulted, he was noted to have soft blood pressures, and was becoming a little more confused, was subsequently transferred to the ICU for further management. Son and patient at bedside. Provided the majority of the history as patient is somewhat confused. Patient is a Jehovah witness, and both son and daughter stated that he would not want any blood products for transfusion. He has history of chronic kidney disease and follows with an outpatient nephrologist.     SIGNIFICANT EVENTS  2/7 - found confused, hypoglycemic, admitted for altered mental status, subsequently improve until 2/12 2/12- large this is in melena, hemoglobin drop almost 6 points, transferred to the ICU for further workup and management   PAST MEDICAL HISTORY    :  Past Medical History  Diagnosis Date  . Chronic atrial fibrillation (HCC)     a. CHA2DS2-VASc Score is 4 (HTN, DM, Age (2)) On Coumadin in the past, not currently.  . Type 2 diabetes mellitus ([place])   . Gout   . HTN (hypertension)   . HLD (hyperlipidemia)   . ESRD (end stage renal disease) ([place])   .  Patient is Jehovah's Witness     a. per patient's son patient cannot receive blood products   Past Surgical History  Procedure Laterality Date  . Cholecystectomy     Prior to Admission medications   Medication Sig Start Date End Date Taking? Authorizing Provider  allopurinol (ZYLOPRIM) 300 MG tablet Take 300 mg by mouth daily.   Yes Historical Provider, MD  aspirin EC 81 MG tablet Take 81 mg by mouth daily.   Yes Historical Provider, MD  digoxin (LANOXIN) 0.125 MG tablet Take 0.125 mg by mouth daily.   Yes Historical Provider, MD  furosemide (LASIX) 20 MG tablet Take 20 mg by mouth daily.   Yes Historical Provider, MD  glimepiride (AMARYL) 4 MG tablet Take 6 mg by mouth daily with breakfast.   Yes Historical Provider, MD  insulin glargine (LANTUS) 100 UNIT/ML injection Inject 50 Units into the skin daily.    Yes Historical Provider, MD  pioglitazone (ACTOS) 45 MG tablet Take 45 mg by mouth daily.   Yes Historical Provider, MD  simvastatin (ZOCOR) 40 MG tablet Take 40 mg by mouth daily.    Yes Historical Provider, MD  torsemide (DEMADEX) 20 MG tablet Take 20 mg by mouth daily.   Yes Historical Provider, MD  Vitamin D, Ergocalciferol, (DRISDOL) 50000 units CAPS capsule Take 50,000 Units by mouth every 7 (seven) days. Pt takes on Monday.   Yes Historical Provider, MD   No Known Allergies   FAMILY HISTORY   Family History  Problem Relation Age of Onset  . Breast cancer Mother  SOCIAL HISTORY    reports that he quit smoking about 47 years ago. He has never used smokeless tobacco. He reports that he does not drink alcohol or use illicit drugs.  Review of Systems  Constitutional: Positive for malaise/fatigue. Negative for fever and chills.  HENT: Negative for hearing loss and tinnitus.   Eyes: Negative for blurred vision and double vision.  Respiratory: Positive for hemoptysis and sputum production. Negative for cough and shortness of breath.   Cardiovascular: Negative for  palpitations.  Gastrointestinal: Positive for diarrhea, blood in stool and melena. Negative for heartburn, nausea and abdominal pain.  Genitourinary: Negative for dysuria.  Musculoskeletal: Negative for myalgias.  Skin: Negative for itching and rash.  Neurological: Positive for weakness. Negative for dizziness and headaches.  Psychiatric/Behavioral: Negative for depression.      VITAL SIGNS    Temp:  [97.3 F (36.3 C)-98.1 F (36.7 C)] 97.9 F (36.6 C) (02/13 1330) Pulse Rate:  [54-151] 109 (02/13 1600) Resp:  [12-32] 32 (02/13 1600) BP: (75-132)/(32-71) 80/50 mmHg (02/13 1600) SpO2:  [94 %-100 %] 100 % (02/13 1600) HEMODYNAMICS:   VENTILATOR SETTINGS:   INTAKE / OUTPUT:  Intake/Output Summary (Last 24 hours) at 12/26/15 1620 Last data filed at 12/26/15 1500  Gross per 24 hour  Intake 739.26 ml  Output   1226 ml  Net -486.74 ml       PHYSICAL EXAM   Physical Exam  Constitutional: He appears well-developed and well-nourished.  HENT:  Head: Normocephalic and atraumatic.  Right Ear: External ear normal.  Left Ear: External ear normal.  Eyes: Conjunctivae and EOM are normal. Pupils are equal, round, and reactive to light.  Neck: Normal range of motion. Neck supple.  Cardiovascular: Normal rate, regular rhythm and normal heart sounds.   Pulmonary/Chest: Effort normal and breath sounds normal. No respiratory distress. He has no wheezes. He has no rales.  Abdominal: Soft. He exhibits no distension.  Neurological: He is alert.  At some mild confusion  Nursing note and vitals reviewed.      LABS   LABS:  CBC  Recent Labs Lab 12/23/15 0543 12/26/15 0550 12/26/15 0957  WBC 9.9 14.8* 18.8*  HGB 11.5* 5.8* 5.5*  HCT 33.8* 17.3* 16.9*  PLT 128* 168 182   Coag's  Recent Labs Lab 12/26/15 1408  INR 1.37   BMET  Recent Labs Lab 12/24/15 0428 12/25/15 0440 12/26/15 0550  NA 132* 131* 129*  K 5.0 4.6 5.5*  CL 98* 95* 94*  CO2 23 26 25   BUN  107* 138* 185*  CREATININE 4.48* 4.19* 4.43*  GLUCOSE 346* 349* 287*   Electrolytes  Recent Labs Lab 12/21/15 1015  12/24/15 0428 12/25/15 0440 12/26/15 0550  CALCIUM  --   < > 8.5* 8.3* 8.0*  MG 2.0  --   --   --   --   < > = values in this interval not displayed. Sepsis Markers No results for input(s): LATICACIDVEN, PROCALCITON, O2SATVEN in the last 168 hours. ABG No results for input(s): PHART, PCO2ART, PO2ART in the last 168 hours. Liver Enzymes No results for input(s): AST, ALT, ALKPHOS, BILITOT, ALBUMIN in the last 168 hours. Cardiac Enzymes  Recent Labs Lab 12/21/15 0228 12/21/15 0835 12/21/15 1431  TROPONINI 0.25* 0.23* 0.16*   Glucose  Recent Labs Lab 12/25/15 1115 12/25/15 1700 12/25/15 2131 12/26/15 0722 12/26/15 1131 12/26/15 1324  GLUCAP 255* 250* 289* 292* 327* 312*     Recent Results (from the past 240 hour(s))  MRSA  PCR Screening     Status: None   Collection Time: 12/21/15  5:51 PM  Result Value Ref Range Status   MRSA by PCR NEGATIVE NEGATIVE Final    Comment:        The GeneXpert MRSA Assay (FDA approved for NASAL specimens only), is one component of a comprehensive MRSA colonization surveillance program. It is not intended to diagnose MRSA infection nor to guide or monitor treatment for MRSA infections.      Current facility-administered medications:  .  acetaminophen (TYLENOL) tablet 650 mg, 650 mg, Oral, Q6H PRN **OR** acetaminophen (TYLENOL) suppository 650 mg, 650 mg, Rectal, Q6H PRN, Lance Coon, MD .  allopurinol (ZYLOPRIM) tablet 150 mg, 150 mg, Oral, Daily, Gladstone Lighter, MD, 150 mg at 12/25/15 [id number] .  atorvastatin (LIPITOR) tablet 40 mg, 40 mg, Oral, q1800, Gladstone Lighter, MD, 40 mg at 12/25/15 1707 .  calcium gluconate 1 g in sodium chloride 0.9 % 100 mL IVPB, 1 g, Intravenous, Once, Harrie Foreman, MD, 1 g at 12/26/15 0715 .  dextrose 50 % solution 50 mL, 1 ampule, Intravenous, PRN, Lance Coon, MD, 25 mL  at 12/21/15 0956 .  epoetin alfa (EPOGEN,PROCRIT) injection 10,000 Units, 10,000 Units, Intravenous, Once, Sarath Kolluru, MD .  insulin aspart (novoLOG) injection 0-15 Units, 0-15 Units, Subcutaneous, TID WC, Gladstone Lighter, MD, 11 Units at 12/26/15 1146 .  insulin aspart (novoLOG) injection 0-5 Units, 0-5 Units, Subcutaneous, QHS, Gladstone Lighter, MD, 3 Units at 12/25/15 2154 .  insulin aspart (novoLOG) injection 7 Units, 7 Units, Subcutaneous, TID WC, Gladstone Lighter, MD, 7 Units at 12/25/15 1707 .  metoprolol (LOPRESSOR) injection 5 mg, 5 mg, Intravenous, Q6H PRN, Gladstone Lighter, MD .  norepinephrine (LEVOPHED) 4 mg in dextrose 5 % 250 mL (0.016 mg/mL) infusion, 0-40 mcg/min, Intravenous, Titrated, Vaughan Basta, MD, Last Rate: 18.8 mL/hr at 12/26/15 1333, 5 mcg/min at 12/26/15 1333 .  ondansetron (ZOFRAN) tablet 4 mg, 4 mg, Oral, Q6H PRN **OR** ondansetron (ZOFRAN) injection 4 mg, 4 mg, Intravenous, Q6H PRN, Lance Coon, MD, 4 mg at 12/26/15 1145 .  pantoprazole (PROTONIX) 80 mg in sodium chloride 0.9 % 250 mL (0.32 mg/mL) infusion, 8 mg/hr, Intravenous, Continuous, Christiane Landis Martins, NP .  predniSONE (DELTASONE) tablet 50 mg, 50 mg, Oral, Daily, Gladstone Lighter, MD, 50 mg at 12/25/15 0808 .  sodium bicarbonate 150 mEq in dextrose 5 % 1,000 mL infusion, , Intravenous, Continuous, Lavonia Dana, MD, Last Rate: 50 mL/hr at 12/26/15 1300 .  sodium chloride flush (NS) 0.9 % injection 3 mL, 3 mL, Intravenous, Q12H, Lance Coon, MD, 3 mL at 12/25/15 2156 .  tuberculin injection 5 Units, 5 Units, Intradermal, Once, Lavonia Dana, MD, 5 Units at 12/26/15 1334  IMAGING    No results found.    Indwelling Urinary Catheter continued, requirement due to   Reason to continue Indwelling Urinary Catheter for strict Intake/Output monitoring for hemodynamic instability   Central Line continued, requirement due to   Reason to continue [employer] Monitoring of central  venous pressure or other hemodynamic parameters   Ventilator continued, requirement due to, resp failure    Ventilator Sedation RASS 0 to -2   Cultures: BCx2  UC  Sputum  Antibiotics:  Lines: R Fem VasCath 2/13>> L Fem CVL 2/13  ASSESSMENT/PLAN  81 year old male past medical history of stage IV CK D, acute uremia, chronic atrial fibrillation, diabetes, gout, hypertension, hyperlipidemia, seen in consultation for hypotension, suspected GIB  PULMONARY -stable respiratory status -keep Supplemental oxygen, keep  O2 sats greater than 88% -  Incentive spirometry daily - hypovolemic shock secondary to GI loss-see below, continued volume resuscitation, patient Jehovah's Witness and does not want any blood transfusions/blood products. -Maintain MAP >65, may use pressors   CARDIOVASCULAR CVL L Fem Hypovolemic shock secondary to acute blood loss anemia from GI bleed Chronic atrial fibrillation with RVR Ashman phenomenon P:  -see above -currently being followed by cardiology, previously on diltiazem 60 mg every 6 hours, monitor this closely as blood pressure is currently low and patient is currently requiring pressors.  RENAL Acute renal failure on chronic kidney disease stage IV Hyperkalemia Hyponatremia Metabolic acidosis Uremia P:   Nephrology following -Vas-Cath placed in right femoral vein, will start short course of dialysis and see how patient tolerates -Gentle IV fluids as needed, bicarbonate drip -Uremia secondary to elevated BUN  GASTROINTESTINAL Suspected GI bleed, hematemesis P:   - GI following, followup recommendations -Jehovah's Witness, no blood products/blood transfusions -Continue with pantoprazole drip -Volume resuscitation -Supportive care -Possible endoscopy once more stable  HEMATOLOGIC Acute blood loss anemia secondary to suspected GI bleed P:  -follow CBC -Volume resuscitation, gentle   ENDOCRINE - ICU hypo/hyperglyemic  protocol   NEUROLOGIC A:  AMS Metabolic encephalopathy P:   RASS goal: 0 - secondary to uremia and metabolic derrangements.    SOCIAL  - I have had a prolonged discussion with the patient's son and daughter who have healthcare power of attorney, they tell me that the mother has dementia and is currently in a rehabilitation facility. In collaboration with vascular surgery, nephrology, primary care physician, gastroenterology service, patient with critical illness and without blood products has a poor prognosis. Family has decided to continue with full medical support however, patient is now a DO NOT RESUSCITATE. -Family has also decided that they will attempt short course of dialysis given the level of his uremia and worsening renal function, if he tolerates he will be dialysis dependent. Nephrology has discussed this plan with the patient family and they're in agreement with.   I have personally obtained a history, examined the patient, evaluated laboratory and imaging results, formulated the assessment and plan and placed orders.  The Patient requires high complexity decision making for assessment and support, frequent evaluation and titration of therapies, application of advanced monitoring technologies and extensive interpretation of multiple databases. Critical Care Time devoted to patient care services described in this note is 85 minutes.   Overall, patient is critically ill, prognosis is guarded. Patient at high risk for cardiac arrest and death.   Vilinda Boehringer, MD [facility] Pager [phone] (please enter 7-digits) On Call Pager [phone] (please enter 7-digits)     12/26/2015, 4:20 PM  Note: This note was prepared with Dragon dictation along with smaller phrase technology. Any transcriptional errors that result from this process are unintentional.

## 2015-12-26 NOTE — Consult Note (Signed)
Please see full GI consult by Ms Jacqulyn Liner.  Pateitn seen and examined, chart reviewed.  Patient is a 49 male presenting with  Hematemesis and melena.  Last emesis and bm this am at about 0930.   Currently on iv pressor, iv ppi drip.  Patietn is declining transfusion of blood products due to religious reasons. Sedated procedure is very high risk in the current clinical setting.  This has been discussed with  Family.  Recommend ammonia level, continued ppi drip, epo as noted, serial hgb.  Following.

## 2015-12-26 NOTE — Progress Notes (Signed)
Patient is alert and oriented x 3. V-tach is sustaining now and patient is asymptomatic. V/S are stable. Dr. Marcille Blanco notified but no new order received. Will continue to monitor.

## 2015-12-26 NOTE — Progress Notes (Signed)
82 year old male past medical history of stage IV CK D, acute uremia, chronic atrial fibrillation, diabetes, gout, hypertension, hyperlipidemia, seen in consultation for hypotension. Full consult note to follow. Currently with acute GI bleed with hemoglobin of 5.8, requiring urgent dialysis and central line placement.  Son and daughter at bedside, stated that patient's wife has dementia and is in a facility (rehabilitation), family has decided on short course of dialysis to see if patient would tolerate, and they have spoke with nephrology about this. They have also stated that patient is a Jehovah witness and at no time would want any kind of blood products even for resuscitation. He has an acute GI bleed with an acute drop in hemoglobin and at this time family has refused any type of blood product transfusion, this is with respect to the faith. However, family has decided on full medical treatment which includes dialysis, central line placement, antibiotics, pressors, IV fluids, etc. They have stated that the patient is a DO NOT RESUSCITATE/DO NOT INTUBATE.   Vilinda Boehringer, MD [facility] Pager [phone] (please enter 7-digits) On Call Pager - [phone] (please enter 7-digits)

## 2015-12-26 NOTE — Progress Notes (Signed)
Potassium=5.5, Dr. Marcille Blanco notified and he stated he's going to put some orders in to be administered.

## 2015-12-26 NOTE — Plan of Care (Signed)
Problem: Nutrition Goal: Patient maintains weight Outcome: Not Progressing Pt NPO.  HGB drop.  Suspect Gi bleed. Bloody stools and vomitus prior to transfer to ICU.  Problem: Activity: Goal: Ability to tolerate increased activity will improve Outcome: Not Progressing Bedrest.  HGB 5.5 Jehovah's witness  Problem: Cardiac: Goal: Ability to achieve and maintain adequate cardiopulmonary perfusion will improve Outcome: Progressing Levophed gtt at 12 mcg/min. Afib with rate 110-120's. Denies chestpain.  Skin warm and dry  Problem: Education: Goal: Knowledge of disease or condition will improve Outcome: Progressing Family have spoken with GI, ICU, and renal doctors. They are  aware of gravity of situation.  Plan for treatment and pt wishes discussed.  He was made A DNR. Family request we go ahead with pressors and  Hemodialysis. Triple lumen left femoral and HD cath to right groin inserted Goal: Understanding of medication regimen will improve Outcome: Progressing Aware of purpose of protonix gtt and levophed

## 2015-12-26 NOTE — Progress Notes (Signed)
Patient: Miguel Guerrero / Admit Date: 12/21/2015 / Date of Encounter: 12/26/2015, 10:27 AM   Subjective: Spoke with patient's son this morning in the room. 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  Patient had episode of melena and hemoptysis this morning. HGB dropped from 11.5 on 2/10-->5.8 this morning. GI consult has been placed. There was question of V-tach overnight. Review of telemetry showed long R-R interval followed by short R-R interval with is consistent with Ashman's phenomenon. Review of further leads does not show WTC as well.    Review of Systems: Review of Systems  Unable to perform ROS: acuity of condition    Objective: Telemetry: Afib with RVR, 1-teens to 120's Physical Exam: Blood pressure 113/48, pulse 110, temperature 97.3 F (36.3 C), temperature source Oral, resp. rate 18, height 6\' 1"  (1.854 m), weight 237 lb 1.6 oz (107.548 kg), SpO2 97 %. Body mass index is 31.29 kg/(m^2). General: Critically ill appearing. Head: Normocephalic, atraumatic, sclera non-icteric, no xanthomas, nares are without discharge. Neck: Negative for carotid bruits. JVP not elevated. Lungs: Clear bilaterally to auscultation without wheezes, rales, or rhonchi. Breathing is unlabored. Heart: Tachycardic, irregularly-irregular, S1 S2 without murmurs, rubs, or gallops.  Abdomen: Soft, non-tender, non-distended with normoactive bowel sounds. No rebound/guarding. Extremities: No clubbing or cyanosis. No edema. Distal pedal pulses are 2+ and equal bilaterally. Neuro: Opens eyes to loud command. Psych:  Somnolent.   Intake/Output Summary (Last 24 hours) at 12/26/15 1027 Last data filed at 12/26/15 0926  Gross per 24 hour  Intake    896 ml  Output   1426 ml  Net   -530 ml    Inpatient Medications:  . sodium chloride   Intravenous Once  . allopurinol  150 mg Oral Daily  . atorvastatin  40 mg Oral q1800  . calcium gluconate  1 g Intravenous Once  .  diltiazem  60 mg Oral 4 times per day  . insulin aspart  0-15 Units Subcutaneous TID WC  . insulin aspart  0-5 Units Subcutaneous QHS  . insulin aspart  7 Units Subcutaneous TID WC  . insulin glargine  45 Units Subcutaneous QHS  . pantoprazole (PROTONIX) IV  40 mg Intravenous Q12H  . predniSONE  50 mg Oral Daily  . sodium chloride flush  3 mL Intravenous Q12H  . sodium polystyrene  30 g Oral Once   Infusions:  . sodium chloride 40 mL/hr at 12/25/15 1148    Labs:  Recent Labs  12/25/15 0440 12/26/15 0550  NA 131* 129*  K 4.6 5.5*  CL 95* 94*  CO2 26 25  GLUCOSE 349* 287*  BUN 138* 185*  CREATININE 4.19* 4.43*  CALCIUM 8.3* 8.0*   No results for input(s): AST, ALT, ALKPHOS, BILITOT, PROT, ALBUMIN in the last 72 hours.  Recent Labs  12/26/15 0550 12/26/15 0957  WBC 14.8* 18.8*  HGB 5.8* 5.5*  HCT 17.3* 16.9*  MCV 91.8 92.4  PLT 168 182   No results for input(s): CKTOTAL, CKMB, TROPONINI in the last 72 hours. Invalid input(s): POCBNP No results for input(s): HGBA1C in the last 72 hours.   Weights: Filed Weights   01/07/2016 1841 12/26/2015 2300  Weight: 240 lb (108.863 kg) 237 lb 1.6 oz (107.548 kg)     Radiology/Studies:  Ct Abdomen Pelvis Wo Contrast  12/22/2015  CLINICAL DATA:  Foul-smelling vomitus. Diarrhea and poor appetite. History of chronic renal disease. EXAM: CT ABDOMEN AND PELVIS WITHOUT CONTRAST TECHNIQUE: Multidetector CT  imaging of the abdomen and pelvis was performed following the standard protocol without IV contrast. COMPARISON:  None. FINDINGS: Lower chest: Nonspecific peribronchovascular and subpleural interstitial thickening and reticulation, incompletely evaluated due to motion artifact. There is calcified atherosclerotic disease of the coronary arteries. Aortic valve annulus calcifications are also seen. Hepatobiliary: No mass visualized on this un-enhanced exam. Sub cm right hepatic dome cyst. Status post prior cholecystectomy. Pancreas: No mass or  inflammatory process identified on this un-enhanced exam. Spleen: Within normal limits in size. Adrenals/Urinary Tract: There is left renal atrophy and bilateral perirenal fat stranding. Stomach/Bowel: No evidence of obstruction, inflammatory process, or abnormal fluid collections. There are 2 probable duodenal diverticula (image 42 sequence 2) cysts. Vascular/Lymphatic: No pathologically enlarged lymph nodes. No evidence of abdominal aortic aneurysm. Reproductive: No mass or other significant abnormality. Prostatic gland calcifications are noted, nonspecific finding. Other: Mild nonspecific mesenteric stranding. Musculoskeletal: No suspicious bone lesions identified. There are multilevel osteoarthritic changes of the thoracic and lumbosacral spine. Likely degenerative compression deformity with approximately 40% height loss of T12 vertebral body is seen. Milder inferior endplate compression deformity of L2 vertebral body is also noted. IMPRESSION: No evidence of small-bowel obstruction or significant inflammatory changes. Non specific mesenteric stranding. Left renal atrophy and bilateral perinephric stranding, consistent with the given history of chronic renal failure. Otherwise, no evidence of significant abnormalities of the solid abdominal organs, accounting for lack of IV contrast. Focal duodenal prominence likely represents 2 duodenal diverticula. Poorly evaluated peribronchovascular and subpleural interstitial thickening and reticulation in the lower lobes of the lungs. This may represent an element of chronic interstitial lung disease. Electronically Signed   By: Fidela Salisbury M.D.   On: 12/22/2015 14:58   Dg Chest 2 View  01/10/2016  CLINICAL DATA:  Repeated episodes of hypoglycemia. EXAM: CHEST  2 VIEW COMPARISON:  None. FINDINGS: Cardiomegaly. Mild vascular congestion. RIGHT pleural effusion. No overt failure or focal infiltrates. No areas of significant atelectasis. Indeterminate age lower  thoracic compression deformity. Osteopenia. IMPRESSION: Cardiomegaly with mild vascular congestion. RIGHT pleural effusion. No overt failure or infiltrates. Electronically Signed   By: Staci Righter M.D.   On: 12/18/2015 19:31   Dg Elbow 2 Views Left  12/22/2015  CLINICAL DATA:  Left elbow pain, difficult to straighten. EXAM: LEFT ELBOW - 2 VIEW COMPARISON:  None. FINDINGS: An IV and associated Hep-Lock tubing project over the left elbow. There is a small elbow joint effusion, with the posterior fat pad minimally visible. Soft tissue swelling overlies the olecranon. There is mild spurring at the attachment site of the distal triceps. The supinator fat pad is indistinct and potentially effaced. There are some linear lucencies projecting along the radial head which are probably due to striations in the overlying soft tissue rather than the radial head fracture, although the supinator fat pad effacement can be associated with radial head fracture. No other significant bony findings. IMPRESSION: 1. Small elbow joint effusion with minimal visibility of the posterior fat pad along the bony margin. 2. Effaced supinator fat pad. This might be due to the underlying generalized edema in the subcutaneous tissues and within the region, but this can sometimes be associated with occult radial head fracture. 3. Soft tissue swelling overlying the olecranon, although this may be part of the generalized subcutaneous edema rather than necessarily indicating olecranon bursitis. Electronically Signed   By: Van Clines M.D.   On: 12/22/2015 12:13   US Renal  12/22/2015  CLINICAL DATA:  Acute renal failure of unknown etiology.  EXAM: RENAL / URINARY TRACT ULTRASOUND COMPLETE COMPARISON:  Abdominal and pelvic noncontrast CT scan of December 22, 2015 FINDINGS: Right Kidney: Length: 12.9 cm. The renal cortical echotexture remains lower than that of the adjacent liver. There is no discrete mass. There is no hydronephrosis. Little  vascular flow is observed with Doppler. Left Kidney: Length: 9.3 cm. There is cortical atrophy. The cortical echotexture is greater than that on the right. There is no hydronephrosis. Limited vascular flow is observed with Doppler. Bladder: Appears normal for degree of bladder distention. The prevoid volume is 406 cc. The postvoid volume is 127 cc. IMPRESSION: Parenchymal changes and atrophy consistent with medical renal disease on the left. The renal size and echotexture on the right appears normal. However, vascular flow appears diminished bilaterally. Moderate-sized postvoid residual urinary bladder volume. Electronically Signed   By: David  Martinique M.D.   On: 12/22/2015 15:04     Assessment and Plan   1. Chronic Afib with RVR: Currently on diltiazem 60 mg q 6 hours with IV metoprolol 5 mg prn tachycardia. Would aim for rate control as his warfarin has been discontinued in the setting of his acute anemia with melena and hemoptysis. Renal function precludes usage of digoxin at this time. CHADS2VASC of at least 4 (HTN, age x 2, DM). Tele shows what appears to be Ashman's phenomenon rather than V-tach.    2. Acute anemia: Has had GI bleed leading to HGB of 5.8. 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 He can receive EPO.   3. ESRD: He will be starting dialysis today per renal.   4. Hyperkalemia: Starting dialysis today.   5. Failure to thrive: Consider Palliative consult   6. Elevated troponin: Echo with normal LV systolic function. Medical management at this time.   Melvern Banker, PA-C Pager: [phone] 12/26/2015, 10:27 AM

## 2015-12-26 NOTE — Progress Notes (Signed)
Report called to [facility] in CCU

## 2015-12-26 NOTE — NC FL2 (Deleted)
[place] LEVEL OF CARE SCREENING TOOL     IDENTIFICATION  Patient Name: Miguel Guerrero Birthdate: 1933/03/15 Sex: male Admission Date (Current Location): 12/18/2015  [place] and [place] Number:  Engineering geologist and Address:  [facility], [address] [place], [place] [address]      Provider Number: [id number]  Attending Physician Name and Address:  Vaughan Basta, MD  Relative Name and Phone Number:       Current Level of Care: Hospital Recommended Level of Care: [place] Prior Approval Number:    Date Approved/Denied:   PASRR Number:  ([id number])  Discharge Plan: SNF    Current Diagnoses: Patient Active Problem List   Diagnosis Date Noted  . Acute renal failure ([place])   . Chronic a-fib ([place]) 12/14/2015  . Hypoglycemia 01/04/2016  . Elevated troponin 01/05/2016  . Type 2 diabetes mellitus ([place]) 12/22/2015  . HTN (hypertension) 01/01/2016  . HLD (hyperlipidemia) 12/29/2015  . ESRD (end stage renal disease) ([place]) 01/07/2016    Orientation RESPIRATION BLADDER Height & Weight     Self, Place  Normal Incontinent Weight: 237 lb 1.6 oz (107.548 kg) Height:  6\' 1"  (185.4 cm)  BEHAVIORAL SYMPTOMS/MOOD NEUROLOGICAL BOWEL NUTRITION STATUS   (None )  (None ) Continent Diet (Carb Modified )  AMBULATORY STATUS COMMUNICATION OF NEEDS Skin   Extensive Assist Verbally Normal                       Personal Care Assistance Level of Assistance  Bathing, Feeding, Dressing Bathing Assistance: Limited assistance Feeding assistance: Independent Dressing Assistance: Limited assistance     Functional Limitations Info  Sight, Speech, Hearing Sight Info: Impaired (Patient has impaired vision in both eyes) Hearing Info: Impaired (Patient has impaired hearing in both ears ) Speech Info: Adequate    SPECIAL CARE FACTORS FREQUENCY  PT (By licensed PT)     PT Frequency:  (5)              Contractures       Additional Factors Info  Code Status, Allergies, Insulin Sliding Scale Code Status Info:  (Full Code ) Allergies Info:  (None )   Insulin Sliding Scale Info:  (insulin aspart (novoLOG) injection 0-5 Units 0-5 Units, Subcutaneous, Daily at bedtime  &  insulin aspart (novoLOG) injection 0-15 Units 0-15 Units, Subcutaneous, 3 times daily with meals)       Current Medications (12/26/2015):  This is the current hospital active medication list Current Facility-Administered Medications  Medication Dose Route Frequency Provider Last Rate Last Dose  . 0.9 %  sodium chloride infusion   Intravenous Continuous Munsoor Lateef, MD 40 mL/hr at 12/25/15 1148    . acetaminophen (TYLENOL) tablet 650 mg  650 mg Oral Q6H PRN Lance Coon, MD       Or  . acetaminophen (TYLENOL) suppository 650 mg  650 mg Rectal Q6H PRN Lance Coon, MD      . allopurinol (ZYLOPRIM) tablet 150 mg  150 mg Oral Daily Gladstone Lighter, MD   150 mg at 12/25/15 [id number]  . aspirin EC tablet 81 mg  81 mg Oral Daily Lance Coon, MD   81 mg at 12/25/15 [id number]  . atorvastatin (LIPITOR) tablet 40 mg  40 mg Oral q1800 Gladstone Lighter, MD   40 mg at 12/25/15 1707  . calcium gluconate 1 g in sodium chloride 0.9 % 100 mL IVPB  1 g Intravenous Once Harrie Foreman, MD      .  dextrose 50 % solution 50 mL  1 ampule Intravenous PRN Lance Coon, MD   25 mL at 12/21/15 0956  . diltiazem (CARDIZEM) tablet 60 mg  60 mg Oral 4 times per day Gladstone Lighter, MD   60 mg at 12/26/15 0525  . heparin injection 5,000 Units  5,000 Units Subcutaneous 3 times per day Lance Coon, MD   5,000 Units at 12/26/15 0520  . insulin aspart (novoLOG) injection 0-15 Units  0-15 Units Subcutaneous TID WC Gladstone Lighter, MD   5 Units at 12/25/15 1708  . insulin aspart (novoLOG) injection 0-5 Units  0-5 Units Subcutaneous QHS Gladstone Lighter, MD   3 Units at 12/25/15 2154  . insulin aspart (novoLOG) injection 7 Units  7 Units Subcutaneous TID WC Gladstone Lighter, MD   7 Units at 12/25/15 1707  . insulin glargine (LANTUS) injection 45 Units  45 Units Subcutaneous QHS Gladstone Lighter, MD   45 Units at 12/25/15 2155  . metoprolol (LOPRESSOR) injection 5 mg  5 mg Intravenous Q6H PRN Gladstone Lighter, MD      . ondansetron (ZOFRAN) tablet 4 mg  4 mg Oral Q6H PRN Lance Coon, MD       Or  . ondansetron [facility]) injection 4 mg  4 mg Intravenous Q6H PRN Lance Coon, MD   4 mg at 12/22/15 1024  . pantoprazole (PROTONIX) EC tablet 40 mg  40 mg Oral BID AC Charlett Nose, RPH   40 mg at 12/25/15 1707  . predniSONE (DELTASONE) tablet 50 mg  50 mg Oral Daily Gladstone Lighter, MD   50 mg at 12/25/15 [id number]  . sodium chloride flush (NS) 0.9 % injection 3 mL  3 mL Intravenous Q12H Lance Coon, MD   3 mL at 12/25/15 2156     Discharge Medications: Please see discharge summary for a list of discharge medications.  Relevant Imaging Results:  Relevant Lab Results:   Additional Information  (SSN [id number])  Lorenso Quarry Val Schiavo, LCSW

## 2015-12-26 NOTE — Care Management (Addendum)
Patient's family in agreement with discharge to skilled nursing.  Spoke with Dr Juleen China.  Hemodialysis is to be initiated and at present, it is not known if it will be chronic.  Dialysis has been notified.  This will delay discharge.  Patient's hgb is 5.8 and has vomited black emesis.  Gi consult is in progress.  Patient is Jehovah Witness  which prevents blood transfusion

## 2015-12-26 NOTE — Procedures (Signed)
  Procedure Note: Left Femoral Central Venous Catheter Placement  Miguel Guerrero , [id number] , [id number]/[id number]  Indications: Hemodynamic monitoring / Intravenous access  Benefits, risks (including bleeding, infection,  Injury, etc.), and alternatives explained to patient and son who voiced understanding.  Questions were sought and answered.   Patient and son agreed to proceed with the procedure.  Consent is signed and on chart. A time-out was completed verifying correct patient, procedure and site.  A 3 lumen catheter available at the time of procedure.  The patient was placed in a dependent position appropriate for central line placement based on the vein to be cannulated.   The patient's LEFT Femoral Vein was prepped and draped in a sterile fashion.  1% Lidocaine WAS used to anesthetize the surrounding skin area.   A 3 lumen catheter was introduced into the LEFT  Femoral Vein using Seldinger technique, visualized under ultrasound.  The catheter was threaded smoothly over the guide wire and appropriate blood return was obtained.  Each lumen of the catheter was evacuated of air and flushed with sterile saline.  The catheter was then sutured in place to the skin and a sterile dressing applied.  Perfusion to the extremity distal to the point of catheter insertion was checked and found to be adequate.     The patient tolerated the procedure well and there were no complications.  Vilinda Boehringer, MD [facility] Pager [phone] (please enter 7-digits) On Call Pager - [phone] (please enter 7-digits)

## 2015-12-26 NOTE — Progress Notes (Signed)
EKG results reported to Dr. Marcille Blanco and he stated he'll come and look at it in our unit.

## 2015-12-26 NOTE — Progress Notes (Signed)
Chaplain rounded in the unit and provided a compassionate presence to the patient an said a silent prayer. Chaplain Shauntelle Jamerson [phone] 

## 2015-12-26 NOTE — Clinical Social Work Note (Deleted)
Miguel Guerrero at [facility] stated that no one has heard yet from patient's family regarding admission paperwork. CSW has provided Miguel Guerrero with patient's son's phone number so that they can call him as well.  Miguel Guerrero MSW,LCSW [phone]

## 2015-12-26 NOTE — Consult Note (Signed)
[facility] V[facility] Consult Note  MRN : [id number]  Miguel Guerrero is a 82 y.o. ([birth year]) male who presents with chief complaint of  Chief Complaint  Patient presents with  . Hypoglycemia  .  History of Present Illness: Patient is admitted to the hospital with GI bleed and severe anemia with hypotension and has developed acute renal failure.  The patient reports little due to his severity of illness he has limited ability to give history.  The patient is critically ill with a BUN of 185 and elevated Cr. He has a Hgb of 5.5 and ongoing GI bleed. The nephrology service has decided to initiate dialysis at this time, and we are asked to place a temporary dialysis catheter for immediate dialysis use.    Current Facility-Administered Medications  Medication Dose Route Frequency Provider Last Rate Last Dose  . 0.9 %  sodium chloride infusion   Intravenous Continuous Algernon Huxley, MD      . acetaminophen (TYLENOL) tablet 650 mg  650 mg Oral Q6H PRN Lance Coon, MD       Or  . acetaminophen (TYLENOL) suppository 650 mg  650 mg Rectal Q6H PRN Lance Coon, MD      . allopurinol (ZYLOPRIM) tablet 150 mg  150 mg Oral Daily Gladstone Lighter, MD   150 mg at 12/25/15 [id number]  . atorvastatin (LIPITOR) tablet 40 mg  40 mg Oral q1800 Gladstone Lighter, MD   40 mg at 12/25/15 1707  . calcium gluconate 1 g in sodium chloride 0.9 % 100 mL IVPB  1 g Intravenous Once Harrie Foreman, MD   1 g at 12/26/15 0715  . dextrose 50 % solution 50 mL  1 ampule Intravenous PRN Lance Coon, MD   25 mL at 12/21/15 0956  . insulin aspart (novoLOG) injection 0-15 Units  0-15 Units Subcutaneous TID WC Gladstone Lighter, MD   11 Units at 12/26/15 1146  . insulin aspart (novoLOG) injection 0-5 Units  0-5 Units Subcutaneous QHS Gladstone Lighter, MD   3 Units at 12/25/15 2154  . insulin aspart (novoLOG) injection 7 Units  7 Units Subcutaneous TID WC Gladstone Lighter, MD   7 Units at 12/25/15 1707  .  metoprolol (LOPRESSOR) injection 5 mg  5 mg Intravenous Q6H PRN Gladstone Lighter, MD      . norepinephrine (LEVOPHED) 4 mg in dextrose 5 % 250 mL (0.016 mg/mL) infusion  0-40 mcg/min Intravenous Titrated Vaughan Basta, MD 18.8 mL/hr at 12/26/15 1333 5 mcg/min at 12/26/15 1333  . ondansetron (ZOFRAN) tablet 4 mg  4 mg Oral Q6H PRN Lance Coon, MD       Or  . ondansetron [facility]) injection 4 mg  4 mg Intravenous Q6H PRN Lance Coon, MD   4 mg at 12/26/15 1145  . pantoprazole (PROTONIX) 80 mg in sodium chloride 0.9 % 250 mL (0.32 mg/mL) infusion  8 mg/hr Intravenous Continuous Christiane Landis Martins, NP      . predniSONE (DELTASONE) tablet 50 mg  50 mg Oral Daily Gladstone Lighter, MD   50 mg at 12/25/15 [id number]  . sodium bicarbonate 150 mEq in dextrose 5 % 1,000 mL infusion   Intravenous Continuous Lavonia Dana, MD 50 mL/hr at 12/26/15 1212    . sodium chloride flush (NS) 0.9 % injection 3 mL  3 mL Intravenous Q12H Lance Coon, MD   3 mL at 12/25/15 2156  . tuberculin injection 5 Units  5 Units Intradermal Once Lavonia Dana, MD   5 Units  at 12/26/15 1334    Past Medical History  Diagnosis Date  . Chronic atrial fibrillation (HCC)     a. CHA2DS2-VASc Score is 4 (HTN, DM, Age (2)) On Coumadin in the past, not currently.  . Type 2 diabetes mellitus ([place])   . Gout   . HTN (hypertension)   . HLD (hyperlipidemia)   . ESRD (end stage renal disease) ([place])   . Patient is Jehovah's Witness     a. per patient's son patient cannot receive blood products    Past Surgical History  Procedure Laterality Date  . Cholecystectomy      Social History Social History  Substance Use Topics  . Smoking status: Former Smoker    Quit date: 11/12/1968  . Smokeless tobacco: Never Used  . Alcohol Use: No  no IVDU  Family History Family History  Problem Relation Age of Onset  . Breast cancer Mother   no bleeding disorders, clotting disorders, or autoimmune diseases  No Known  Allergies   REVIEW OF SYSTEMS (Negative unless checked)   Difficult to obtain the review of systems due to the patient's severe systemic illness and altered mental status.       Physical Examination  Filed Vitals:   12/26/15 1300 12/26/15 1305 12/26/15 1310 12/26/15 1315  BP: 87/48 82/57 75/32  84/53  Pulse: 121 151 110 80  Temp:      TempSrc:      Resp: 18 16 16 12   Height:      Weight:      SpO2: 95% 97% 94% 94%   Body mass index is 31.29 kg/(m^2). Gen: WD/WN. Critically ill appearing Head: [place]/AT, No temporalis wasting Ear/Nose/Throat: Hearing grossly intact, nares w/o erythema or drainage,  Eyes: PERRLA, EOMI.  Neck: Supple, no nuchal rigidity.  No JVD.  Pulmonary:  Good air movement, clear to auscultation bilaterally.  Cardiac: RRR, tachycardic.  Gastrointestinal: soft, non-tender/non-distended. No guarding/reflex.  Musculoskeletal: M/S 5/5 throughout.  Extremities without ischemic changes.  No deformity or atrophy. Moderate Edema in the lower extremities bilaterally Neurologic: sleepy but does respond Psychiatric: Difficult to assess due to the severity of patient's illness. Dermatologic: No rashes or ulcers noted.  pale Lymph : No Cervical, Axillary, or Inguinal lymphadenopathy.  CBC Lab Results  Component Value Date   WBC 18.8* 12/26/2015   HGB 5.5* 12/26/2015   HCT 16.9* 12/26/2015   MCV 92.4 12/26/2015   PLT 182 12/26/2015    BMET    Component Value Date/Time   NA 129* 12/26/2015 0550   K 5.5* 12/26/2015 0550   CL 94* 12/26/2015 0550   CO2 25 12/26/2015 0550   GLUCOSE 287* 12/26/2015 0550   BUN 185* 12/26/2015 0550   CREATININE 4.43* 12/26/2015 0550   CALCIUM 8.0* 12/26/2015 0550   GFRNONAA 11* 12/26/2015 0550   GFRAA 13* 12/26/2015 0550   Estimated Creatinine Clearance: 16.8 mL/min (by C-G formula based on Cr of 4.43).  COAG No results found for: INR, PROTIME  Radiology Ct Abdomen Pelvis Wo Contrast  12/22/2015  CLINICAL DATA:   Foul-smelling vomitus. Diarrhea and poor appetite. History of chronic renal disease. EXAM: CT ABDOMEN AND PELVIS WITHOUT CONTRAST TECHNIQUE: Multidetector CT imaging of the abdomen and pelvis was performed following the standard protocol without IV contrast. COMPARISON:  None. FINDINGS: Lower chest: Nonspecific peribronchovascular and subpleural interstitial thickening and reticulation, incompletely evaluated due to motion artifact. There is calcified atherosclerotic disease of the coronary arteries. Aortic valve annulus calcifications are also seen. Hepatobiliary: No mass visualized on this  un-enhanced exam. Sub cm right hepatic dome cyst. Status post prior cholecystectomy. Pancreas: No mass or inflammatory process identified on this un-enhanced exam. Spleen: Within normal limits in size. Adrenals/Urinary Tract: There is left renal atrophy and bilateral perirenal fat stranding. Stomach/Bowel: No evidence of obstruction, inflammatory process, or abnormal fluid collections. There are 2 probable duodenal diverticula (image 42 sequence 2) cysts. Vascular/Lymphatic: No pathologically enlarged lymph nodes. No evidence of abdominal aortic aneurysm. Reproductive: No mass or other significant abnormality. Prostatic gland calcifications are noted, nonspecific finding. Other: Mild nonspecific mesenteric stranding. Musculoskeletal: No suspicious bone lesions identified. There are multilevel osteoarthritic changes of the thoracic and lumbosacral spine. Likely degenerative compression deformity with approximately 40% height loss of T12 vertebral body is seen. Milder inferior endplate compression deformity of L2 vertebral body is also noted. IMPRESSION: No evidence of small-bowel obstruction or significant inflammatory changes. Non specific mesenteric stranding. Left renal atrophy and bilateral perinephric stranding, consistent with the given history of chronic renal failure. Otherwise, no evidence of significant abnormalities of  the solid abdominal organs, accounting for lack of IV contrast. Focal duodenal prominence likely represents 2 duodenal diverticula. Poorly evaluated peribronchovascular and subpleural interstitial thickening and reticulation in the lower lobes of the lungs. This may represent an element of chronic interstitial lung disease. Electronically Signed   By: Fidela Salisbury M.D.   On: 12/22/2015 14:58   Dg Chest 2 View  01/03/2016  CLINICAL DATA:  Repeated episodes of hypoglycemia. EXAM: CHEST  2 VIEW COMPARISON:  None. FINDINGS: Cardiomegaly. Mild vascular congestion. RIGHT pleural effusion. No overt failure or focal infiltrates. No areas of significant atelectasis. Indeterminate age lower thoracic compression deformity. Osteopenia. IMPRESSION: Cardiomegaly with mild vascular congestion. RIGHT pleural effusion. No overt failure or infiltrates. Electronically Signed   By: Staci Righter M.D.   On: 01/01/2016 19:31   Dg Elbow 2 Views Left  12/22/2015  CLINICAL DATA:  Left elbow pain, difficult to straighten. EXAM: LEFT ELBOW - 2 VIEW COMPARISON:  None. FINDINGS: An IV and associated Hep-Lock tubing project over the left elbow. There is a small elbow joint effusion, with the posterior fat pad minimally visible. Soft tissue swelling overlies the olecranon. There is mild spurring at the attachment site of the distal triceps. The supinator fat pad is indistinct and potentially effaced. There are some linear lucencies projecting along the radial head which are probably due to striations in the overlying soft tissue rather than the radial head fracture, although the supinator fat pad effacement can be associated with radial head fracture. No other significant bony findings. IMPRESSION: 1. Small elbow joint effusion with minimal visibility of the posterior fat pad along the bony margin. 2. Effaced supinator fat pad. This might be due to the underlying generalized edema in the subcutaneous tissues and within the region, but  this can sometimes be associated with occult radial head fracture. 3. Soft tissue swelling overlying the olecranon, although this may be part of the generalized subcutaneous edema rather than necessarily indicating olecranon bursitis. Electronically Signed   By: Van Clines M.D.   On: 12/22/2015 12:13   US Renal  12/22/2015  CLINICAL DATA:  Acute renal failure of unknown etiology. EXAM: RENAL / URINARY TRACT ULTRASOUND COMPLETE COMPARISON:  Abdominal and pelvic noncontrast CT scan of December 22, 2015 FINDINGS: Right Kidney: Length: 12.9 cm. The renal cortical echotexture remains lower than that of the adjacent liver. There is no discrete mass. There is no hydronephrosis. Little vascular flow is observed with Doppler. Left Kidney: Length: 9.3  cm. There is cortical atrophy. The cortical echotexture is greater than that on the right. There is no hydronephrosis. Limited vascular flow is observed with Doppler. Bladder: Appears normal for degree of bladder distention. The prevoid volume is 406 cc. The postvoid volume is 127 cc. IMPRESSION: Parenchymal changes and atrophy consistent with medical renal disease on the left. The renal size and echotexture on the right appears normal. However, vascular flow appears diminished bilaterally. Moderate-sized postvoid residual urinary bladder volume. Electronically Signed   By: David  Martinique M.D.   On: 12/22/2015 15:04      Assessment/Plan 1. ARF 2. Uremia.  Certainly contributing to platelet dysfunction and bleeding.  Would be improved with dialysis 3. Acute blood loss anemia. Hgb now 5.5 and he is a Jehovah's witness and can not take blood. Has continued GI bleeding. Very difficult situation and prognosis is poor.    Temporary dialysis catheter can be placed.  Family discussion ongoing and at current, aggressive care is planned.  Dialysis would improve uremic platelet dysfunction.  If the patient's renal function does not improve throughout the hospital course  and he improves otherwise, we will be happy to place a tunneled dialysis catheter for long term use prior to discharge.     Bresha Hosack, MD  12/26/2015 1:40 PM

## 2015-12-26 NOTE — Consult Note (Signed)
Consultation  Referring Provider:     Dr Ether Griffins Admit date: 12/23/15 Consult date    12/26/15     Reason for Consultation: anemia exacerbation/hematemesis             HPI:   Miguel Guerrero is a 82 y.o. male  With history of DM, CKD stage IV, and chronic atrial fibrillation, admitted 12/23/15 with hypoglycemia/AMS/unresponsiveness. GI consulted today due to sudden drop of hgb and hematemesis over the last 2d.  Patient currently very slow to respond and obtunded. Bun 185, K+ 5.5, Creatinine 4.43. Admission hgb was 11.5 with some minimal thrombocytopenia (128).  CBC has been reassessed and found to have normal platelet and wbc, but hgb of 5.5: this has been repeated and is 5.8.  He is a Sales promotion account executive Witness and blood products have been declined by the family. Patient history has been obtained via chart review and in conversation with his son. Evidently his appetite has been decreased latetly and he has had foul breath. There is no known abdominal pain/NVD.GERD symptoms, bowel habit changes, or melena/hematochezia by his son. In discussion with nurse, patient was admitted to ICU to  have his diabetes stabilized: this improved but then developed some black emesis over the last 24h and was returned to the intensive care unit for further care.  I do not see any evidence of prior endoscopic evaluation, and family is unaware of any.    Past Medical History  Diagnosis Date  . Chronic atrial fibrillation (HCC)     a. CHA2DS2-VASc Score is 4 (HTN, DM, Age (2)) On Coumadin in the past, not currently.  . Type 2 diabetes mellitus ([place])   . Gout   . HTN (hypertension)   . HLD (hyperlipidemia)   . ESRD (end stage renal disease) ([place])   . Patient is Jehovah's Witness     a. per patient's son patient cannot receive blood products    Past Surgical History  Procedure Laterality Date  . Cholecystectomy      Family History  Problem Relation Age of Onset  . Breast cancer Mother      Social History  Substance  Use Topics  . Smoking status: Former Smoker    Quit date: 11/12/1968  . Smokeless tobacco: Never Used  . Alcohol Use: No    Prior to Admission medications   Medication Sig Start Date End Date Taking? Authorizing Provider  allopurinol (ZYLOPRIM) 300 MG tablet Take 300 mg by mouth daily.   Yes Historical Provider, MD  aspirin EC 81 MG tablet Take 81 mg by mouth daily.   Yes Historical Provider, MD  digoxin (LANOXIN) 0.125 MG tablet Take 0.125 mg by mouth daily.   Yes Historical Provider, MD  furosemide (LASIX) 20 MG tablet Take 20 mg by mouth daily.   Yes Historical Provider, MD  glimepiride (AMARYL) 4 MG tablet Take 6 mg by mouth daily with breakfast.   Yes Historical Provider, MD  insulin glargine (LANTUS) 100 UNIT/ML injection Inject 50 Units into the skin daily.    Yes Historical Provider, MD  pioglitazone (ACTOS) 45 MG tablet Take 45 mg by mouth daily.   Yes Historical Provider, MD  simvastatin (ZOCOR) 40 MG tablet Take 40 mg by mouth daily.    Yes Historical Provider, MD  torsemide (DEMADEX) 20 MG tablet Take 20 mg by mouth daily.   Yes Historical Provider, MD  Vitamin D, Ergocalciferol, (DRISDOL) 50000 units CAPS capsule Take 50,000 Units by mouth every 7 (seven) days. Pt takes on Monday.  Yes Historical Provider, MD    Current Facility-Administered Medications  Medication Dose Route Frequency Provider Last Rate Last Dose  . acetaminophen (TYLENOL) tablet 650 mg  650 mg Oral Q6H PRN Lance Coon, MD       Or  . acetaminophen (TYLENOL) suppository 650 mg  650 mg Rectal Q6H PRN Lance Coon, MD      . allopurinol (ZYLOPRIM) tablet 150 mg  150 mg Oral Daily Gladstone Lighter, MD   150 mg at 12/25/15 [id number]  . atorvastatin (LIPITOR) tablet 40 mg  40 mg Oral q1800 Gladstone Lighter, MD   40 mg at 12/25/15 1707  . calcium gluconate 1 g in sodium chloride 0.9 % 100 mL IVPB  1 g Intravenous Once Harrie Foreman, MD   1 g at 12/26/15 0715  . dextrose 50 % solution 50 mL  1 ampule  Intravenous PRN Lance Coon, MD   25 mL at 12/21/15 0956  . insulin aspart (novoLOG) injection 0-15 Units  0-15 Units Subcutaneous TID WC Gladstone Lighter, MD   11 Units at 12/26/15 1146  . insulin aspart (novoLOG) injection 0-5 Units  0-5 Units Subcutaneous QHS Gladstone Lighter, MD   3 Units at 12/25/15 2154  . insulin aspart (novoLOG) injection 7 Units  7 Units Subcutaneous TID WC Gladstone Lighter, MD   7 Units at 12/25/15 1707  . metoprolol (LOPRESSOR) injection 5 mg  5 mg Intravenous Q6H PRN Gladstone Lighter, MD      . norepinephrine (LEVOPHED) 4 mg in dextrose 5 % 250 mL (0.016 mg/mL) infusion  0-40 mcg/min Intravenous Titrated Vaughan Basta, MD 18.8 mL/hr at 12/26/15 1333 5 mcg/min at 12/26/15 1333  . ondansetron (ZOFRAN) tablet 4 mg  4 mg Oral Q6H PRN Lance Coon, MD       Or  . ondansetron [facility]) injection 4 mg  4 mg Intravenous Q6H PRN Lance Coon, MD   4 mg at 12/26/15 1145  . pantoprazole (PROTONIX) 80 mg in sodium chloride 0.9 % 250 mL (0.32 mg/mL) infusion  8 mg/hr Intravenous Continuous Jonnie Kubly Landis Martins, NP      . predniSONE (DELTASONE) tablet 50 mg  50 mg Oral Daily Gladstone Lighter, MD   50 mg at 12/25/15 [id number]  . sodium bicarbonate 150 mEq in dextrose 5 % 1,000 mL infusion   Intravenous Continuous Lavonia Dana, MD 50 mL/hr at 12/26/15 1300    . sodium chloride flush (NS) 0.9 % injection 3 mL  3 mL Intravenous Q12H Lance Coon, MD   3 mL at 12/25/15 2156  . tuberculin injection 5 Units  5 Units Intradermal Once Lavonia Dana, MD   5 Units at 12/26/15 1334    Allergies as of 12/22/2015  . (No Known Allergies)     Review of Systems:    All systems reviewed and negative except where noted in HPI.    Physical Exam:  Vital signs in last 24 hours: Temp:  [97.3 F (36.3 C)-98.1 F (36.7 C)] 97.9 F (36.6 C) (02/13 1330) Pulse Rate:  [54-151] 54 (02/13 1400) Resp:  [12-31] 17 (02/13 1330) BP: (75-132)/(32-71) 99/44 mmHg (02/13 1400) SpO2:  [94  %-100 %] 100 % (02/13 1400) Last BM Date: 12/26/15 General:   Critically ill appearing elderly man, however does appear comfortable  Head:  Normocephalic and atraumatic. Eyes:   No icterus.   Conjunctiva pale. Ears:  Normal auditory acuity. Mouth: Mucosa pink moist, no obvious lesions Neck:  Supple; no masses felt Lungs: Respirations even and unlabored. Fainlty coarse.  Heart:  S1S2, irregular rytm, no MRG. Generalized 1+ edema. Abdomen:   Flat, soft, nondistended, nontender. Normal bowel sounds. No appreciable masses or hepatomegaly. No rebound signs or other peritoneal signs. Msk:  Generalized weakness, No clubbing or cyanosis.  Symmetrical without gross deformities. Neurologic:  Alert at times, limited responses to questions, does not follow all commands. Cranial nerves II-XII appear intact.  Skin:  Warm, dry, pale. without significant lesions or rashes. Psych:  Calm, difficult to assess mood due to mentation  LAB RESULTS:  Recent Labs  12/26/15 0550 12/26/15 0957  WBC 14.8* 18.8*  HGB 5.8* 5.5*  HCT 17.3* 16.9*  PLT 168 182   BMET  Recent Labs  12/24/15 0428 12/25/15 0440 12/26/15 0550  NA 132* 131* 129*  K 5.0 4.6 5.5*  CL 98* 95* 94*  CO2 23 26 25   GLUCOSE 346* 349* 287*  BUN 107* 138* 185*  CREATININE 4.48* 4.19* 4.43*  CALCIUM 8.5* 8.3* 8.0*   LFT No results for input(s): PROT, ALBUMIN, AST, ALT, ALKPHOS, BILITOT, BILIDIR, IBILI in the last 72 hours. PT/INR No results for input(s): LABPROT, INR in the last 72 hours.  STUDIES: No results found.     Impression / Plan:   1. Anemia exacerbation/hematemesis/GIB in critically ill patient with multisystem organ failure. He is very high risk for sedated procedures and at this time it is likely that the risk of the procedure will outweigh the benefit. He is requiring pressors for blood pressure support at this time. He is at risk for GIB relating to stress ulceration and his other comorbidities. He is a  Restaurant manager, fast food. Blood products have been refused by his family, they are speaking for him as he is confused and believe that is what he would choose.  I have discussed his case with Altha Harm in pharmacy: it may be that changing him to Pantoprazole gtt may be of better benefit than IV bid. Did discuss with Dr Nyoka Cowden (radiology): there are no obvious esophageal varices to warrant octreotide. Contacted Dr Rudean Hitt of hematology: there are no other blood stimulators that will have an immediate effects other than PRBC.  Dr Stevenson Clinch and I discussed patient's condition with his son and daughter, including the possibility that not receiving blood product may result in his death. Also discussed his critical condition with them. They are considering how best to proceed. Will discuss his case further with Dr Gustavo Lah.  Thank you very much for this consult. These services were provided by Stephens November, NP-C, in collaboration with Lollie Sails, MD, with whom I have discussed this patient in full.   Stephens November, NP-C Addendum:  did discuss further with Dr Gustavo Lah- will assess an ammonia level and recommend serial hemoglobins for now.

## 2015-12-26 NOTE — Progress Notes (Signed)
PT Cancellation Note  Patient Details Name: Miguel Guerrero MRN: [id number] DOB: Aug 21, 1933   Cancelled Treatment:    Reason Eval/Treat Not Completed: Medical issues which prohibited therapy. PT aware of new discontinue orders as patient has been transferred to the CCU. Patient has a Hgb of 5.5, but is unable to receive blood transfusion due to religious beliefs. PT will complete orders, should additional orders become necessary please feel free to re-consult.   Kerman Passey, PT, DPT    12/26/2015, 1:16 PM

## 2015-12-27 LAB — HEPATITIS B SURFACE ANTIGEN: HEP B S AG: NEGATIVE

## 2015-12-27 LAB — HEPATITIS B SURFACE ANTIBODY,QUALITATIVE: Hep B S Ab: NONREACTIVE

## 2015-12-27 LAB — KAPPA/LAMBDA LIGHT CHAINS
KAPPA, LAMDA LIGHT CHAIN RATIO: 1.47 (ref 0.26–1.65)
Kappa free light chain: 44.57 mg/L — ABNORMAL HIGH (ref 3.30–19.40)
Lambda free light chains: 30.33 mg/L — ABNORMAL HIGH (ref 5.71–26.30)

## 2015-12-27 LAB — HEPATITIS B CORE ANTIBODY, IGM: HEP B C IGM: NEGATIVE

## 2015-12-28 LAB — TYPE AND SCREEN
ABO/RH(D): A POS
ANTIBODY SCREEN: NEGATIVE
UNIT DIVISION: 0
UNIT DIVISION: 0
Unit division: 0
Unit division: 0
Unit division: 0
Unit division: 0

## 2015-12-28 LAB — HEPATITIS B CORE ANTIBODY, TOTAL: HEP B C TOTAL AB: NEGATIVE

## 2015-12-28 LAB — HEPATITIS C ANTIBODY

## 2015-12-28 LAB — PREPARE RBC (CROSSMATCH)

## 2016-01-11 NOTE — Discharge Summary (Signed)
Death on 01/09/2016  Cause of death- GI bleed  Other contributing causes- acute renal failure, atrial fibrillation.  Hospital course and short summary   Patient was admitted with episode of hypoglycemia and acute worsening renal failure, kidney function did not improve enough during the hospital stay for few days, while nephrologist where still doing workups to find out the reason for his renal failure and they decided to start him on hemodialysis on 13th of February. Meanwhile and 13 February early morning he had episode of hematemesis and melena, and his hemoglobin dropped 6 kg compared to his previous hemoglobin report few days ago. His blood pressure was also running on the lower side. Due to his religious believes family refused to have the blood transfusion. He was transferred to stepdown unit, started on levophed IV drip. Also started on IV Protonix and GI consult was called in. 2 unstable for any kind of procedures, and after discussion with family with critical care- he was made DO NOT RESUSCITATE. He died on 2023/01/08 early morning.  For further details please see progress notes done earlier.

## 2016-01-11 NOTE — Progress Notes (Signed)
Made a referral to [facility], pt. Not a candidate. It was requested still that we call at time of death to wrap up the referral on their end.  Reference [id number] Spoke with Leonor Liv

## 2016-01-11 NOTE — Progress Notes (Signed)
2130: Paged Prime MD r/t hypotension on max dose of levo gtt.  Discussed hx, admission, events of day and family's desire to consider comfort care measures with the assistance of a Dr. Before agreeing to escalate care. MD Jannifer [place] unable to assist as only MD in ED with multiple admissions.  2145: Spoke w/ Dr. Elsworth Soho at [facility] who spoke with family- sister discussing options.  Will not escalate care at moment while family makes a decision.  2230: Family decided to go comfort care route, levo gtt off and gave morphine PRN.  MD ordered morphine gtt and stated the decision to leave protonix gtt for now. MD stated to not give insulin ordered and begin comfort measures.

## 2016-01-11 NOTE — Progress Notes (Addendum)
Pt. Became hypotensive during dialysis tx in 60's and had a change in breathing pattern.  Titrated up on levo gtt, dialysis d/c'd, pt. Recovered.

## 2016-01-11 NOTE — Progress Notes (Signed)
Pt.'s heart stopped @ [id number], RN may pronounce death order in place this nurse with verification from Loma [place], RN was completed. Son bedside was notified, Nursing supervisor called and Dr. Marcille Blanco alerted of death.  Called [facility] and spoke with Rachael Darby to complete referral as pt. Was not a viable candidate.

## 2016-01-11 NOTE — Progress Notes (Signed)
[facility] [place] NAME: Miguel Guerrero    MR#:  [id number]  DATE OF BIRTH:  Mar 28, 1933  SUBJECTIVE:  CHIEF COMPLAINT:   Chief Complaint  Patient presents with  . Hypoglycemia   - Renal function slowly improving. Baseline GFR is around 19. Now at 12 - Had hemetemesis, and melena in morning, and Hb dropped 6 points. - BP running lower side, so transferred to ICU. - Pt remains drowsy but easily arousable.  REVIEW OF SYSTEMS:  Review of Systems  Unable to perform ROS: mental acuity    DRUG ALLERGIES:  No Known Allergies  VITALS:  Blood pressure 93/43, pulse 119, temperature 97.5 F (36.4 C), temperature source Oral, resp. rate 17, height 6\' 1"  (1.854 m), weight 107 kg (235 lb 14.3 oz), SpO2 100 %.  PHYSICAL EXAMINATION:  Physical Exam  GENERAL:  82 y.o.-year-old patient lying in the bed with no acute distress.  EYES: Pupils equal, round, reactive to light and accommodation. No scleral icterus. Extraocular muscles intact.  HEENT: Head atraumatic, normocephalic. Oropharynx and nasopharynx clear.  NECK:  Supple, no jugular venous distention. No thyroid enlargement, no tenderness.  LUNGS: Normal breath sounds bilaterally, no wheezing, rales,rhonchi or crepitation. No use of accessory muscles of respiration. Decreased bibasilar breath sounds CARDIOVASCULAR: S1, S2 normal. No rubs, or gallops. 3/6 systolic murmur present ABDOMEN: Soft, nontender, nondistended. Bowel sounds present. No organomegaly or mass.  EXTREMITIES: No cyanosis, or clubbing. 1+ extremity edema NEUROLOGIC: Cranial nerves not checked due to drowsiness, moves limbs slowly on arousal. Sensation intact. Gait not checked. Right knee tenderness anteriorly, no significant swelling or erythema noted. PSYCHIATRIC: The patient is drowsy.  SKIN: No obvious rash, lesion, or ulcer.    LABORATORY PANEL:   CBC  Recent Labs Lab 12/26/15 0957  WBC 18.8*  HGB 5.5*  HCT  16.9*  PLT 182   ------------------------------------------------------------------------------------------------------------------  Chemistries   Recent Labs Lab 12/21/15 1015  12/26/15 0550  NA  --   < > 129*  K  --   < > 5.5*  CL  --   < > 94*  CO2  --   < > 25  GLUCOSE  --   < > 287*  BUN  --   < > 185*  CREATININE  --   < > 4.43*  CALCIUM  --   < > 8.0*  MG 2.0  --   --   < > = values in this interval not displayed. ------------------------------------------------------------------------------------------------------------------  Cardiac Enzymes  Recent Labs Lab 12/21/15 1431  TROPONINI 0.16*   ------------------------------------------------------------------------------------------------------------------  RADIOLOGY:  No results found.  EKG:   Orders placed or performed during the hospital encounter of 12/25/2015  . ED EKG  . ED EKG  . EKG 12-Lead  . EKG 12-Lead  . EKG 12-Lead  . EKG 12-Lead  . EKG 12-Lead  . EKG 12-Lead  . EKG 12-Lead  . EKG 12-Lead    ASSESSMENT AND PLAN:   82 y.o. male with a PMHx of chronic atrial fibrillation, diabetes mellitus type 2, gout, hypertension, chronic kidney disease stage IV followed by [facility], who was admitted to [facility] on 01/06/2016 for evaluation of mental status in the setting of hypoglycemia.   * Acute blood loss due to GI bleed - and anemia as a result.   Causing Shock due to blood loss.      Protonix IV, Urgent GI consult.   Transferred to [facility] unit.   Levophed drip.  Offerred PRBC transfusion, but due to religious reason, family refused for that.   Epiotin given by nephro.  * Hypoglycemia with h/o DM- Likely poor PO intake and also with ARF as patient was on insulin ad amaryl and actos at home - now off of D10 drip, sugars elevated- started lantus at a lower dose  * ARF on CKD stage 4- baseline gfr 19-20 per nephrology notes, now with fluids- improved to gfr 11 - cr improved to 4.1 - renal US  with atrophic kidneys- High M spike on electrophoresis. - no acute indication for dialysis now, but might be heading that way-monitor every day - appreciate nephrology consult - plan is to start on HD today due to worsening condition.  * Nausea/vomiting- CT abd with no obstruction ? Uremia with worsened renal function - Much improved, on protonix BID for now  * Hyperkalemia- HD to start today.  * Left elbow pain- X ray with effusion, may be olecranon bursitis -Could be gout as well.  - Oral prednisone, added allopurinol-significant improvement - ortho consult appreciated.   * Anemia of chronic disease- and now due to acute blood loss.   As above.  * Atrial fibrillation-on oral Cardizem. - hold off anticoagulation with warfarin due to falling at home. - appreciate cardiology consult Monitor for now  ECHO with normal LV function, elevated troponin- likely from demand ischemia in the setting of hypoglycemia  * DVT Prophylaxis- on SQ heparin   Held today due to GI bleed.   Physical therapy consult once more medically stable. Updated Son yesterday.    All the records are reviewed and case discussed with Care Management/Social Workerr. Management plans discussed with the patient, family and they are in agreement.  CODE STATUS: Full Code   Later family agreed on DNR on discussion with Dr. Stevenson Clinch.  TOTAL TIME SPENT IN TAKING CARE OF THIS PATIENT: 50 critical care minutes.   POSSIBLE D/C IN 1-2 DAYS, DEPENDING ON CLINICAL CONDITION.   Vaughan Basta M.D on 2016-01-04 at 8:20 AM  Between 7am to 6pm - Pager - [phone]  After 6pm go to [email or web address] - password [email or web address] [facility]  [facility]  Office  [phone]  CC: Primary care physician; No primary care provider on file.

## 2016-01-11 DEATH — deceased

## 2017-04-30 IMAGING — CR DG CHEST 2V
1 series · 2 of 2 positions shown · non-contrast
Comparison: None.

CLINICAL DATA: Repeated episodes of hypoglycemia.

EXAM:
CHEST  2 VIEW

[Series 1: dg chest 2 view · 0.14mm/px · 2 of 2 slices shown]
[im 1/2]
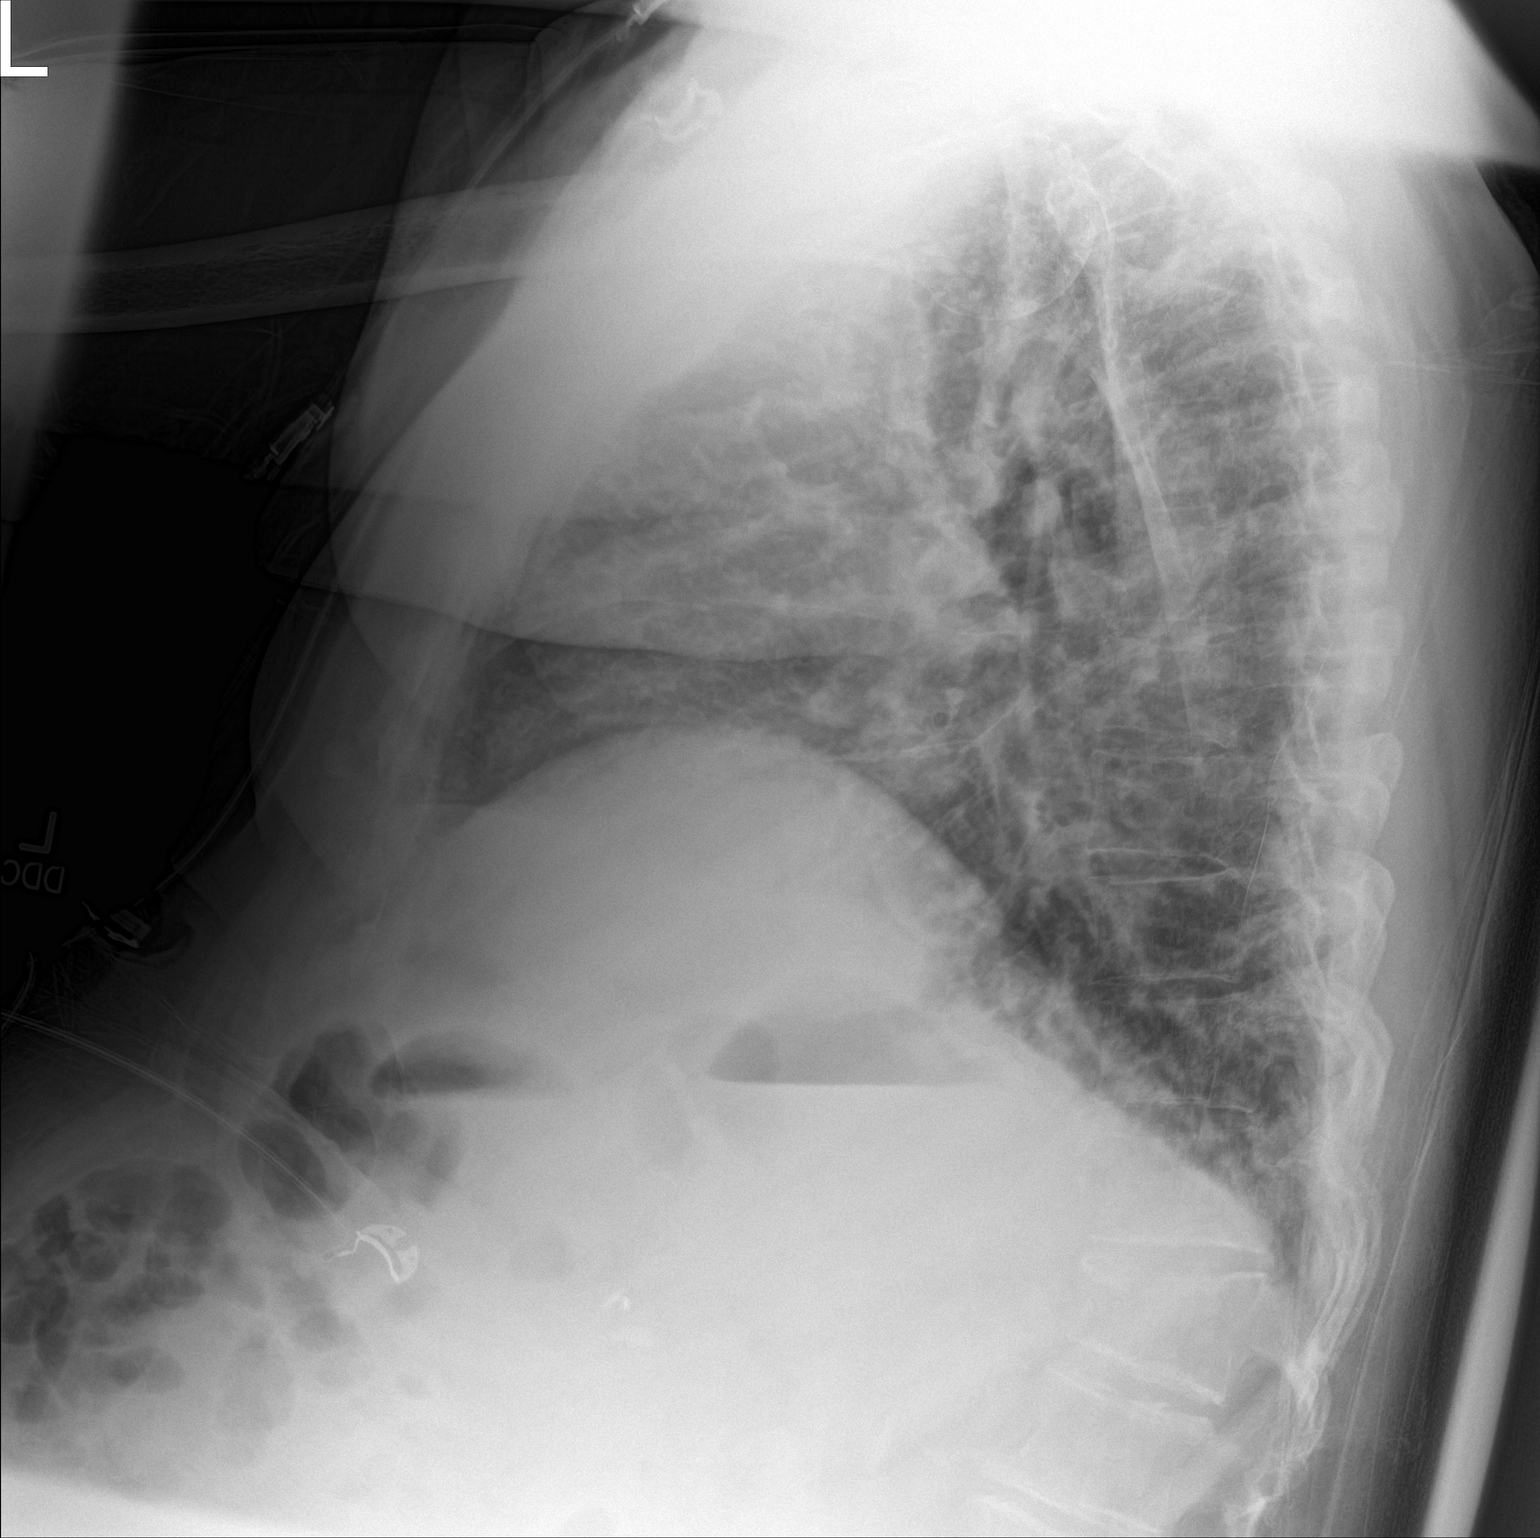
[im 2/2]
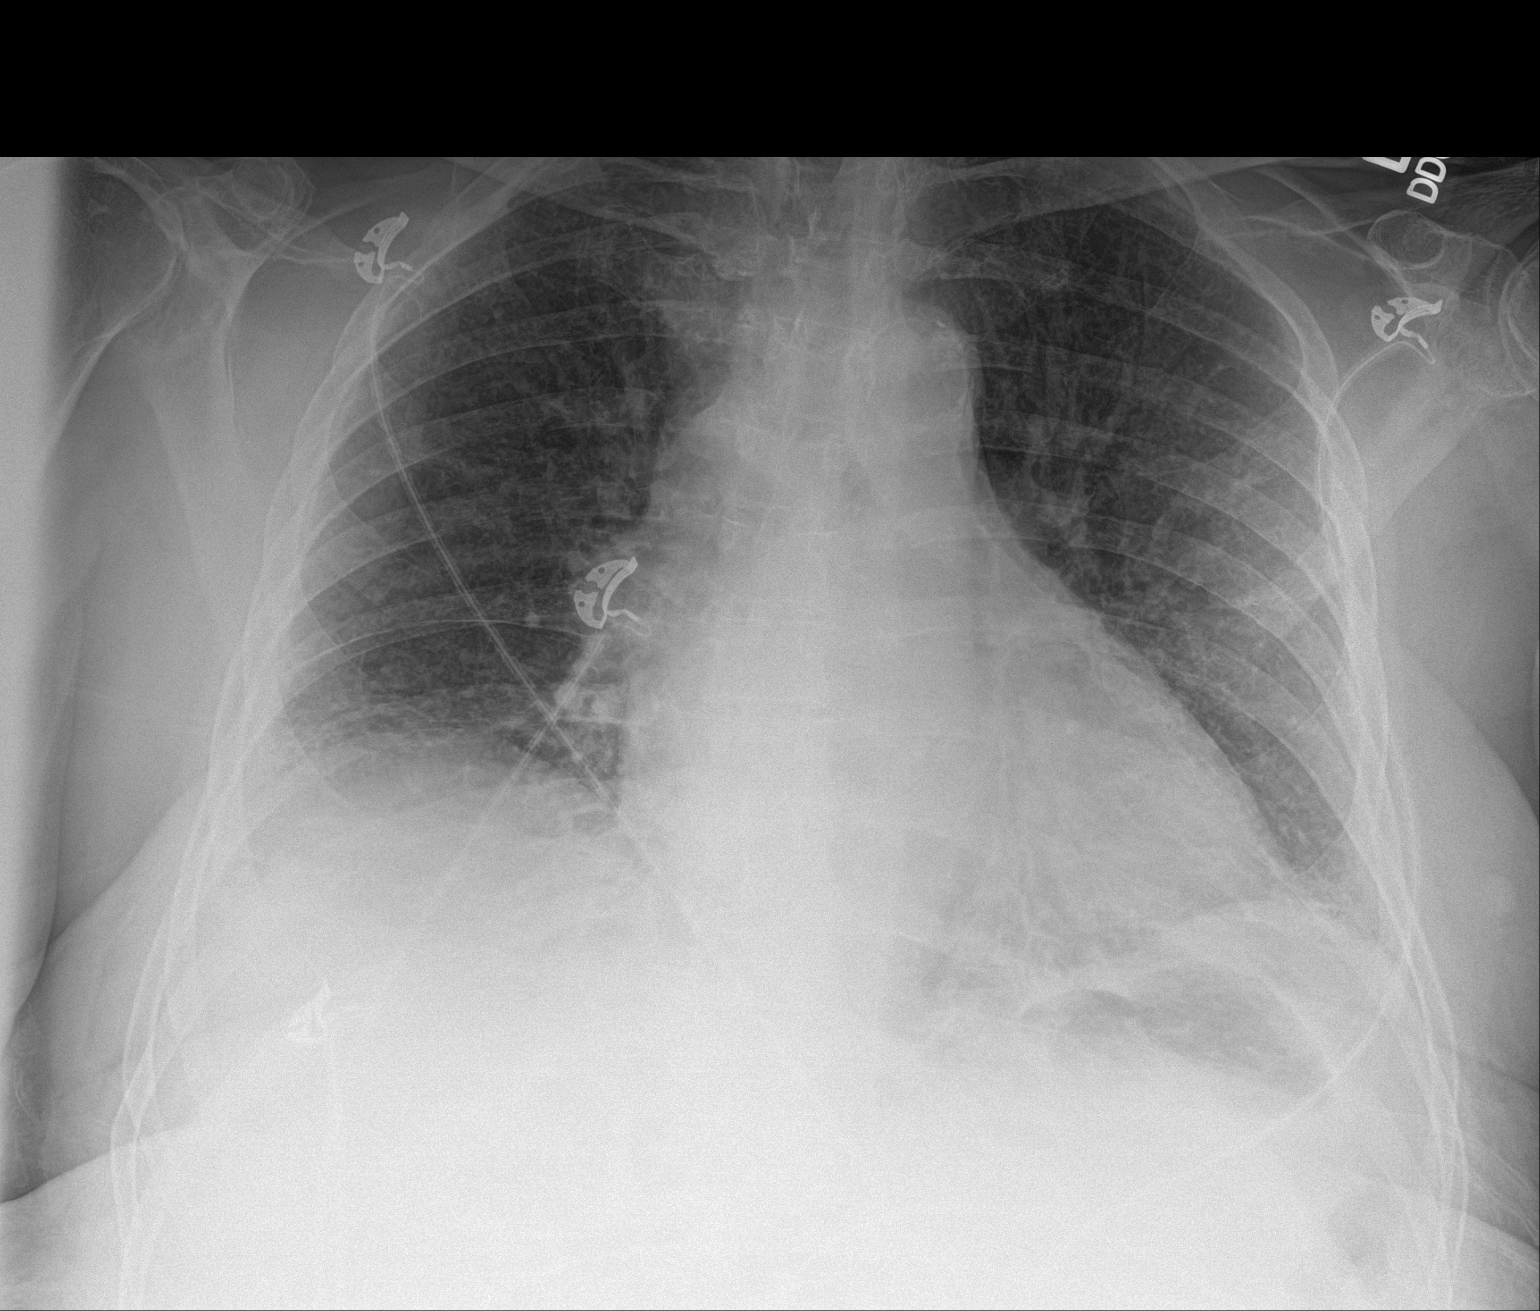

[2 of 2 positions shown; findings below may reference images not displayed]

FINDINGS: Cardiomegaly. Mild vascular congestion. RIGHT pleural effusion. No
overt failure or focal infiltrates. No areas of significant
atelectasis. Indeterminate age lower thoracic compression deformity.
Osteopenia.
IMPRESSION: Cardiomegaly with mild vascular congestion. RIGHT pleural effusion.
No overt failure or infiltrates.

## 2017-05-02 IMAGING — CT CT ABD-PELV W/O CM
2 of 4 series · 15 of 46 positions shown, 17 images · non-contrast
Comparison: None.

CLINICAL DATA: Foul-smelling vomitus. Diarrhea and poor appetite.
History of chronic renal disease.

EXAM:
CT ABDOMEN AND PELVIS WITHOUT CONTRAST
TECHNIQUE: Multidetector CT imaging of the abdomen and pelvis was performed
following the standard protocol without IV contrast.

[Series 2: routine abd pel wo · axial · 0.77mm/px · z∈[-1046,-536]mm · 12 of 112 slices shown, 14 images]
[im 5/112  soft-tissue]
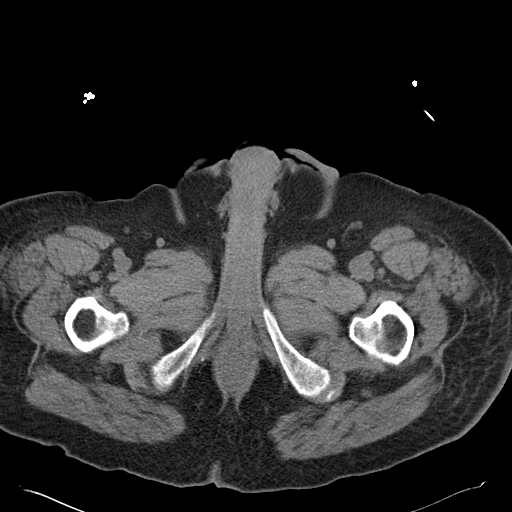
[im 5/112  bone]
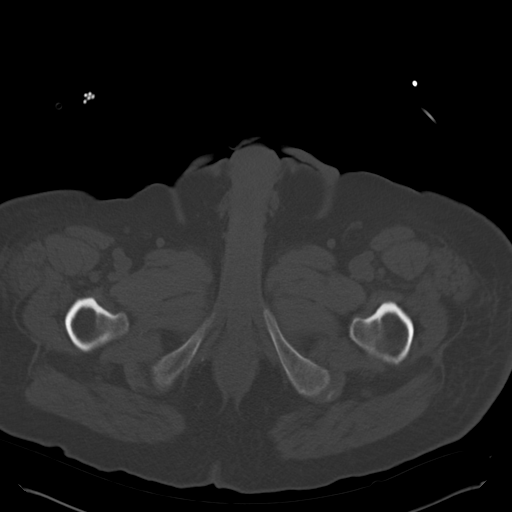
[im 14/112  soft-tissue]
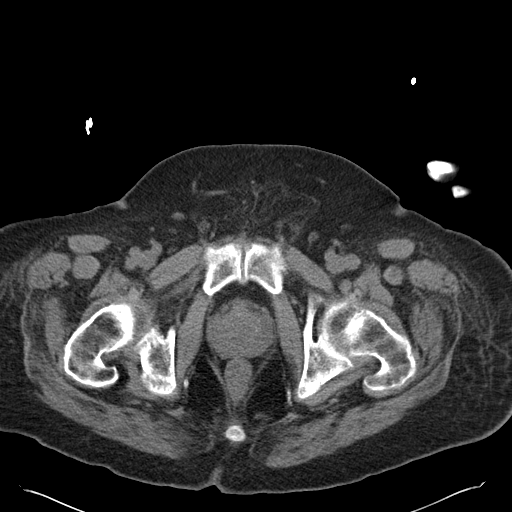
[im 24/112  soft-tissue]
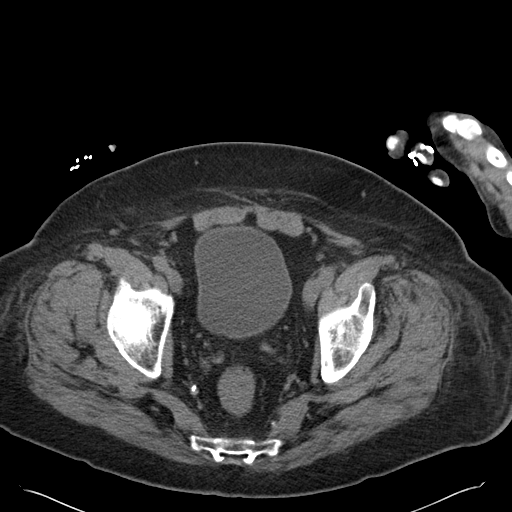
[im 33/112  soft-tissue]
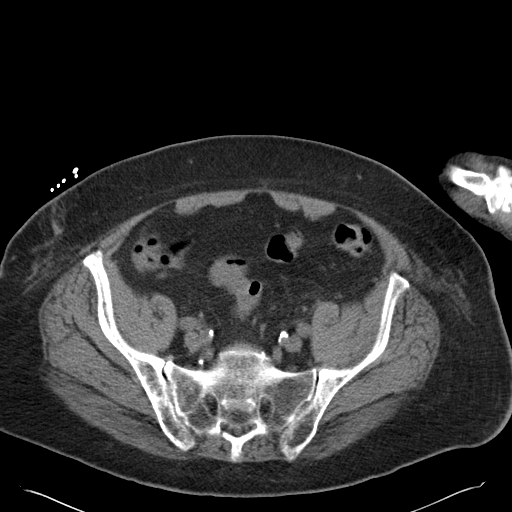
[im 42/112  soft-tissue]
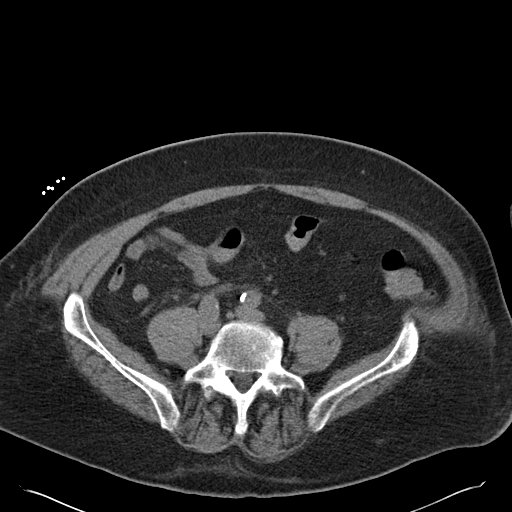
[im 51/112  soft-tissue]
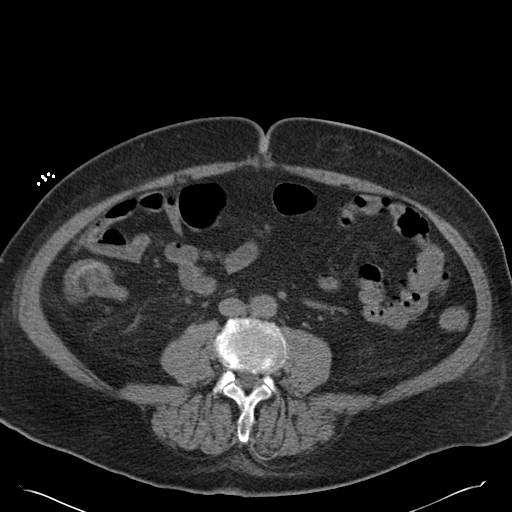
[im 61/112  soft-tissue]
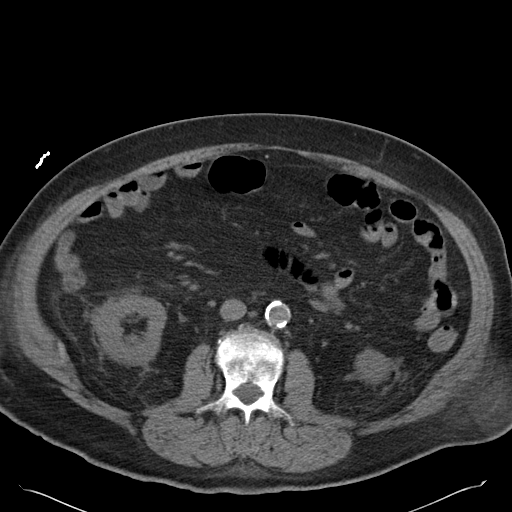
[im 70/112  soft-tissue]
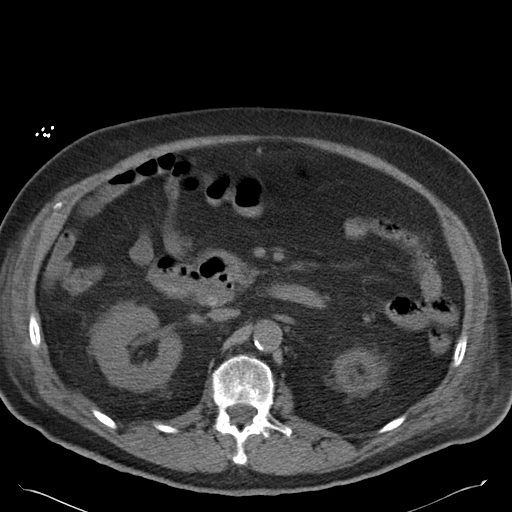
[im 79/112  soft-tissue]
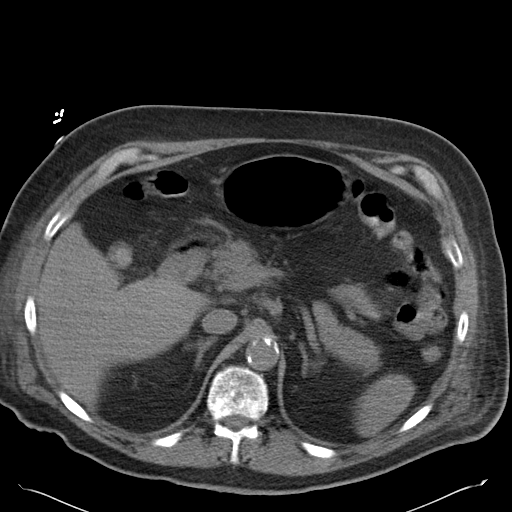
[im 79/112  bone]
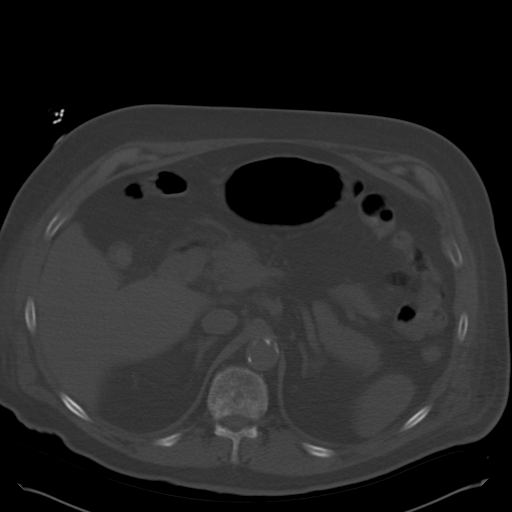
[im 88/112  soft-tissue]
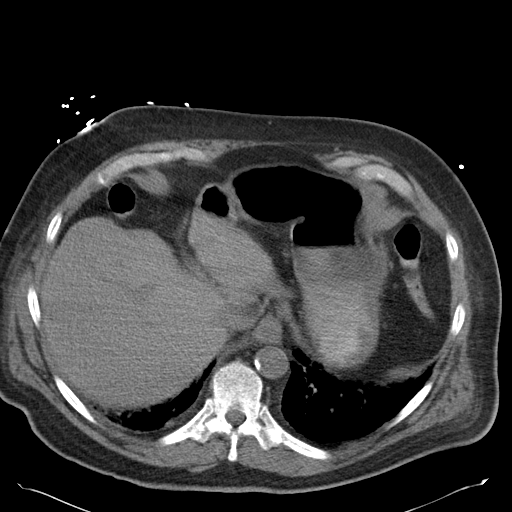
[im 98/112  soft-tissue]
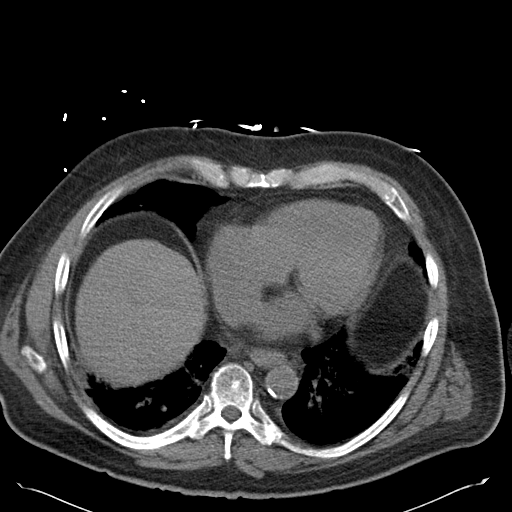
[im 107/112  soft-tissue]
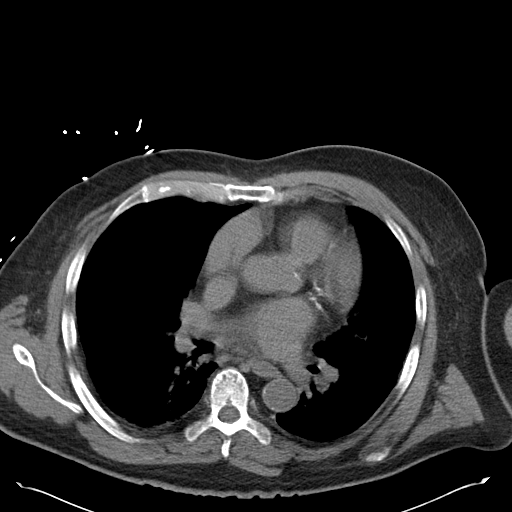

[Series 5: cor routine abd pel wo · coronal · 0.85mm/px · 3 of 147 slices shown]
[im 49/147  soft-tissue]
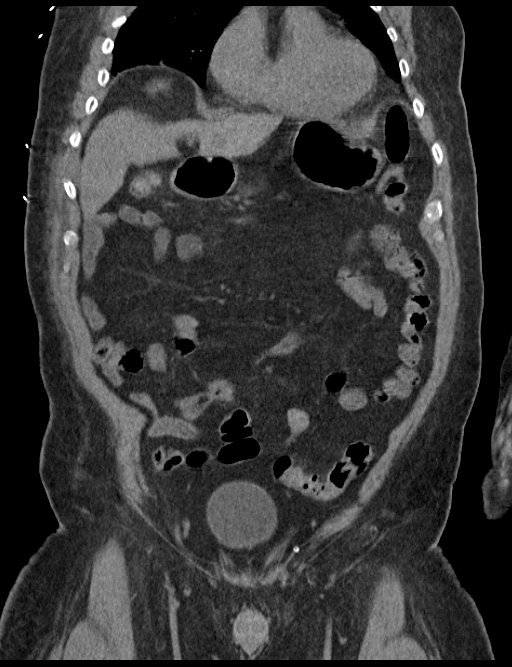
[im 65/147  soft-tissue]
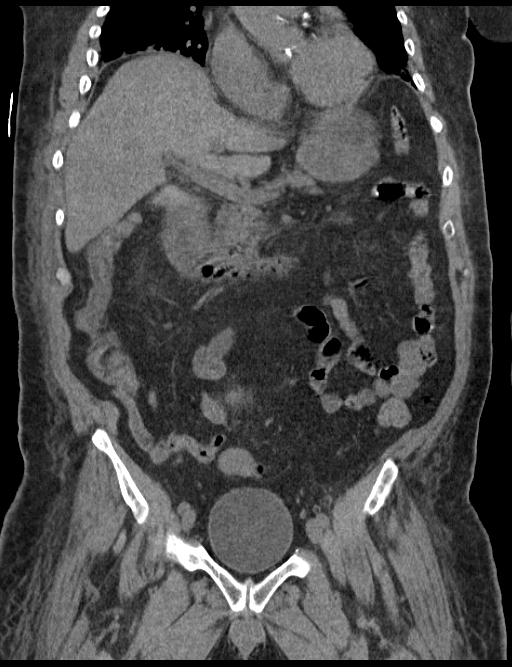
[im 82/147  soft-tissue]
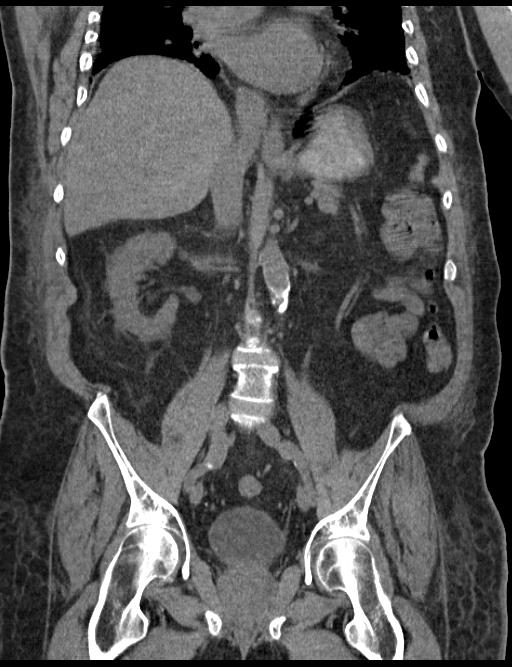

[15 of 46 positions shown; findings below may reference images not displayed]

FINDINGS: Lower chest: Nonspecific peribronchovascular and subpleural
interstitial thickening and reticulation, incompletely evaluated due
to motion artifact. There is calcified atherosclerotic disease of
the coronary arteries. Aortic valve annulus calcifications are also
seen.

Hepatobiliary: No mass visualized on this un-enhanced exam. Sub cm
right hepatic dome cyst. Status post prior cholecystectomy.

Pancreas: No mass or inflammatory process identified on this
un-enhanced exam.

Spleen: Within normal limits in size.

Adrenals/Urinary Tract: There is left renal atrophy and bilateral
perirenal fat stranding.

Stomach/Bowel: No evidence of obstruction, inflammatory process, or
abnormal fluid collections. There are 2 probable duodenal
diverticula (image 42 sequence 2) cysts.

Vascular/Lymphatic: No pathologically enlarged lymph nodes. No
evidence of abdominal aortic aneurysm.

Reproductive: No mass or other significant abnormality. Prostatic
gland calcifications are noted, nonspecific finding.

Other: Mild nonspecific mesenteric stranding.

Musculoskeletal: No suspicious bone lesions identified. There are
multilevel osteoarthritic changes of the thoracic and lumbosacral
spine. Likely degenerative compression deformity with approximately
40% height loss of T12 vertebral body is seen. Milder inferior
endplate compression deformity of L2 vertebral body is also noted.
IMPRESSION: No evidence of small-bowel obstruction or significant inflammatory
changes. Non specific mesenteric stranding.

Left renal atrophy and bilateral perinephric stranding, consistent
with the given history of chronic renal failure.

Otherwise, no evidence of significant abnormalities of the solid
abdominal organs, accounting for lack of IV contrast.

Focal duodenal prominence likely represents 2 duodenal diverticula.

Poorly evaluated peribronchovascular and subpleural interstitial
thickening and reticulation in the lower lobes of the lungs. This
may represent an element of chronic interstitial lung disease.
